# Patient Record
Sex: Female | Born: 1985
Health system: Southern US, Community
[De-identification: ages and names within clinical notes are randomized; demographics above are authoritative.]

## PROBLEM LIST (undated history)

## (undated) DIAGNOSIS — N83201 Unspecified ovarian cyst, right side: Secondary | ICD-10-CM

## (undated) DIAGNOSIS — F410 Panic disorder [episodic paroxysmal anxiety] without agoraphobia: Secondary | ICD-10-CM

## (undated) DIAGNOSIS — N83202 Unspecified ovarian cyst, left side: Secondary | ICD-10-CM

## (undated) DIAGNOSIS — F411 Generalized anxiety disorder: Secondary | ICD-10-CM

## (undated) DIAGNOSIS — E039 Hypothyroidism, unspecified: Secondary | ICD-10-CM

## (undated) HISTORY — DX: Hypothyroidism, unspecified: E03.9

## (undated) HISTORY — DX: Panic disorder (episodic paroxysmal anxiety): F41.0

## (undated) HISTORY — DX: Generalized anxiety disorder: F41.1

## (undated) HISTORY — DX: Unspecified ovarian cyst, right side: N83.202

## (undated) HISTORY — DX: Unspecified ovarian cyst, right side: N83.201

---

## 2016-09-30 DIAGNOSIS — D225 Melanocytic nevi of trunk: Secondary | ICD-10-CM | POA: Diagnosis not present

## 2017-03-31 DIAGNOSIS — E039 Hypothyroidism, unspecified: Secondary | ICD-10-CM | POA: Diagnosis not present

## 2017-08-11 DIAGNOSIS — E038 Other specified hypothyroidism: Secondary | ICD-10-CM | POA: Diagnosis not present

## 2017-08-11 DIAGNOSIS — E063 Autoimmune thyroiditis: Secondary | ICD-10-CM | POA: Diagnosis not present

## 2017-08-11 DIAGNOSIS — E039 Hypothyroidism, unspecified: Secondary | ICD-10-CM | POA: Diagnosis not present

## 2017-11-10 DIAGNOSIS — Z0000 Encounter for general adult medical examination without abnormal findings: Secondary | ICD-10-CM | POA: Diagnosis not present

## 2017-11-10 DIAGNOSIS — E039 Hypothyroidism, unspecified: Secondary | ICD-10-CM | POA: Diagnosis not present

## 2017-11-17 DIAGNOSIS — N6011 Diffuse cystic mastopathy of right breast: Secondary | ICD-10-CM | POA: Diagnosis not present

## 2017-11-17 DIAGNOSIS — E039 Hypothyroidism, unspecified: Secondary | ICD-10-CM | POA: Diagnosis not present

## 2017-11-17 DIAGNOSIS — Z01411 Encounter for gynecological examination (general) (routine) with abnormal findings: Secondary | ICD-10-CM | POA: Diagnosis not present

## 2017-11-17 DIAGNOSIS — F410 Panic disorder [episodic paroxysmal anxiety] without agoraphobia: Secondary | ICD-10-CM | POA: Diagnosis not present

## 2017-11-17 DIAGNOSIS — Z0000 Encounter for general adult medical examination without abnormal findings: Secondary | ICD-10-CM | POA: Diagnosis not present

## 2017-12-10 DIAGNOSIS — F4310 Post-traumatic stress disorder, unspecified: Secondary | ICD-10-CM | POA: Diagnosis not present

## 2017-12-22 DIAGNOSIS — F4310 Post-traumatic stress disorder, unspecified: Secondary | ICD-10-CM | POA: Diagnosis not present

## 2018-01-12 DIAGNOSIS — F4310 Post-traumatic stress disorder, unspecified: Secondary | ICD-10-CM | POA: Diagnosis not present

## 2018-01-26 DIAGNOSIS — F4310 Post-traumatic stress disorder, unspecified: Secondary | ICD-10-CM | POA: Diagnosis not present

## 2018-01-28 ENCOUNTER — Ambulatory Visit (INDEPENDENT_AMBULATORY_CARE_PROVIDER_SITE_OTHER): Payer: BLUE CROSS/BLUE SHIELD | Admitting: Primary Care

## 2018-01-28 ENCOUNTER — Encounter: Payer: Self-pay | Admitting: Primary Care

## 2018-01-28 VITALS — BP 110/70 | HR 72 | Temp 97.9°F | Ht 64.25 in | Wt 140.8 lb

## 2018-01-28 DIAGNOSIS — R002 Palpitations: Secondary | ICD-10-CM | POA: Diagnosis not present

## 2018-01-28 DIAGNOSIS — R39198 Other difficulties with micturition: Secondary | ICD-10-CM | POA: Insufficient documentation

## 2018-01-28 DIAGNOSIS — E039 Hypothyroidism, unspecified: Secondary | ICD-10-CM | POA: Diagnosis not present

## 2018-01-28 DIAGNOSIS — R350 Frequency of micturition: Secondary | ICD-10-CM | POA: Diagnosis not present

## 2018-01-28 DIAGNOSIS — N63 Unspecified lump in unspecified breast: Secondary | ICD-10-CM

## 2018-01-28 DIAGNOSIS — F410 Panic disorder [episodic paroxysmal anxiety] without agoraphobia: Secondary | ICD-10-CM | POA: Insufficient documentation

## 2018-01-28 HISTORY — DX: Other difficulties with micturition: R39.198

## 2018-01-28 LAB — COMPREHENSIVE METABOLIC PANEL
ALT: 10 U/L (ref 0–35)
AST: 12 U/L (ref 0–37)
Albumin: 4.1 g/dL (ref 3.5–5.2)
Alkaline Phosphatase: 45 U/L (ref 39–117)
BUN: 12 mg/dL (ref 6–23)
CHLORIDE: 106 meq/L (ref 96–112)
CO2: 27 meq/L (ref 19–32)
Calcium: 9.2 mg/dL (ref 8.4–10.5)
Creatinine, Ser: 0.79 mg/dL (ref 0.40–1.20)
GFR: 90.41 mL/min (ref >60.00)
GLUCOSE: 85 mg/dL (ref 70–99)
POTASSIUM: 4.2 meq/L (ref 3.5–5.1)
SODIUM: 137 meq/L (ref 135–145)
Total Bilirubin: 0.4 mg/dL (ref 0.2–1.2)
Total Protein: 7.1 g/dL (ref 6.0–8.3)

## 2018-01-28 LAB — CBC
HCT: 40.9 % (ref 36.0–46.0)
Hemoglobin: 14.2 g/dL (ref 12.0–15.0)
MCHC: 34.7 g/dL (ref 30.0–36.0)
MCV: 92.1 fl (ref 78.0–100.0)
Platelets: 242 10*3/uL (ref 150.0–400.0)
RBC: 4.44 Mil/uL (ref 3.87–5.11)
RDW: 12.2 % (ref 11.5–15.5)
WBC: 4 10*3/uL (ref 4.0–10.5)

## 2018-01-28 LAB — HEMOGLOBIN A1C: Hgb A1c MFr Bld: 4.8 % (ref 4.6–6.5)

## 2018-01-28 LAB — TSH: TSH: 4.18 u[IU]/mL (ref 0.35–4.50)

## 2018-01-28 NOTE — Progress Notes (Signed)
Subjective:    Patient ID: Amy Schneider, female    DOB: Apr 09, 1985, 32 y.o.   MRN: [id number]  HPI  [name] is a 32 year old female who presents today to establish care and discuss the problems mentioned below. Will obtain old records.  1) Hypothyroidism: Diagnosed around 2013-2014. Currently managed on Synthroid 25 mcg,  taking Synthroid 12.5 mcg daily. She will feel "manic" on the 25 mcg dose; will feel cold intolerance, hair loss, lethargy without medication all together. Her last TSH was 3.5 in May 2019. She endorses a repeat TSH that was done in August 2019 and was normal.   She takes her Synthroid every morning, waits 15 minutes before eating and doesn't take any medications with Synthroid.   2) Panic Attacks: Currently managed on propranolol 80 mg PRN. Panic attacks occur monthly on average which is more frequently before. Recently lost her mother. Feels well managed. She does experience heart palpitations, mostly occurring at night, sometimes occurs with and without anxiety. Her palpitations will occur once monthly lasting 10 seconds.   3) Abdominal Discomfort: She will experience bilateral lower quadrant abdominal discomfort, gas, oily bowel movements, diarrhea, occasional nausea. This will occur with certain foods such as mixing meats, stress, feeling anxious. Symptoms occur 3-4 times monthly, this has been intermittent for 2-3 years.   She denies unexplained weight loss, bloody stools, vomiting. She's never tried any medication.   4) Ovarian Cysts/Difficulty urinating: Diagnosed years ago with bilateral ovarian cysts, history of ruptured cysts. She is managed on Syeda 3-0.03 mg OCP's. She is not seeing GYN consistently.   She's also experienced symptoms of difficulty urinating. This began in January 2019 and has occurred three times total where she's had to physically push her lower abdomen to get herself to urinate. She denies hematuria, dysuria. She does have chronic  urinary frequency, doesn't drink a lot of water because of this. She does endorse intermittent episodes of slower urinary flow, separate from her incidences of difficulty urinating.   5) Breast Mass: Located to the right breast for which she first noticed several months ago. History of breast cancer in her paternal grandmother. The spot was evaluated by her prior PCP who suspected fibrocystic tissue. She never underwent ultrasound or mammogram.   Review of Systems  Constitutional: Negative for fever.  Respiratory: Negative for shortness of breath.   Cardiovascular: Positive for palpitations. Negative for chest pain.  Gastrointestinal:       Abdominal discomfort, bloating/gas, diarrhea. Intermittent.   Endocrine: Negative for cold intolerance and heat intolerance.  Genitourinary: Positive for difficulty urinating and frequency. Negative for dysuria and vaginal discharge.       Intermittent ovarian cysts  Skin: Negative for color change.  Neurological: Negative for headaches.  Hematological: Negative for adenopathy.  Psychiatric/Behavioral: The patient is nervous/anxious.         Past Medical History:  Diagnosis Date  . Bilateral ovarian cysts   . GAD (generalized anxiety disorder)   . Hypothyroidism   . Panic attacks      Social History   Socioeconomic History  . Marital status: Married    Spouse name: Not on file  . Number of children: Not on file  . Years of education: Not on file  . Highest education level: Not on file  Occupational History  . Not on file  Social Needs  . Financial resource strain: Not on file  . Food insecurity:    Worry: Not on file    Inability: Not  on file  . Transportation needs:    Medical: Not on file    Non-medical: Not on file  Tobacco Use  . Smoking status: Never Smoker  . Smokeless tobacco: Never Used  Substance and Sexual Activity  . Alcohol use: Not Currently  . Drug use: Not on file  . Sexual activity: Not on file  Lifestyle  .  Physical activity:    Days per week: Not on file    Minutes per session: Not on file  . Stress: Not on file  Relationships  . Social connections:    Talks on phone: Not on file    Gets together: Not on file    Attends religious service: Not on file    Active member of club or organization: Not on file    Attends meetings of clubs or organizations: Not on file    Relationship status: Not on file  . Intimate partner violence:    Fear of current or ex partner: Not on file    Emotionally abused: Not on file    Physically abused: Not on file    Forced sexual activity: Not on file  Other Topics Concern  . Not on file  Social History Narrative  . Not on file    History reviewed. No pertinent surgical history.  Family History  Problem Relation Age of Onset  . Alcohol abuse Mother   . Depression Mother   . Early death Mother   . Depression Sister   . Diabetes Maternal Grandmother   . Heart attack Maternal Grandmother   . Heart disease Maternal Grandmother   . Stroke Maternal Grandmother   . Parkinson's disease Maternal Grandfather   . Breast cancer Paternal Grandmother     Allergies  Allergen Reactions  . Hydrocodone-Acetaminophen Nausea And Vomiting    Current Outpatient Medications on File Prior to Visit  Medication Sig Dispense Refill  . drospirenone-ethinyl estradiol (SYEDA) 3-0.03 MG tablet 1 tablet once daily    . propranolol (INDERAL) 80 MG tablet propranolol 80 mg tablet  Take 1 tablet daily as needed forpanic    . SYNTHROID 25 MCG tablet Take 25 mcg by mouth daily before breakfast.     No current facility-administered medications on file prior to visit.     BP 110/70   Pulse 72   Temp 97.9 F (36.6 C) (Oral)   Ht 5' 4.25" (1.632 m)   Wt 140 lb 12 oz (63.8 kg)   LMP 12/30/2017   SpO2 98%   BMI 23.97 kg/m     Objective:   Physical Exam  Constitutional: She is oriented to person, place, and time. She appears well-nourished.  Neck: Neck supple.    Cardiovascular: Normal rate and regular rhythm.  Respiratory: Effort normal and breath sounds normal. Right breast exhibits mass. Right breast exhibits no skin change and no tenderness. Left breast exhibits no mass, no skin change and no tenderness.    Firm, 1 cm, lump noted to right breast at 12 o'clock position. Non tender.   GI: Soft. Bowel sounds are normal. There is no tenderness.  Neurological: She is alert and oriented to person, place, and time.  Skin: Skin is warm and dry.  Psychiatric: She has a normal mood and affect.           Assessment & Plan:

## 2018-01-28 NOTE — Assessment & Plan Note (Signed)
Diagnosed years ago, feels well on Synthroid 12.5 mg. Continue same. Repeat TSH pending.

## 2018-01-28 NOTE — Patient Instructions (Signed)
You will be contacted regarding your referral to Urology and for your mammogram/ultrasounds.  Please let us know if you have not been contacted within one week.   Start keeping track of the foods that cause your stomach symptoms. Take a look at the information below.  Stop by the lab prior to leaving today. I will notify you of your results once received.   It was a pleasure to meet you today! Please don't hesitate to call or message me with any questions. Welcome to [facility]!  Diet for Irritable Bowel Syndrome When you have irritable bowel syndrome (IBS), the foods you eat and your eating habits are very important. IBS may cause various symptoms, such as abdominal pain, constipation, or diarrhea. Choosing the right foods can help ease discomfort caused by these symptoms. Work with your health care provider and dietitian to find the best eating plan to help control your symptoms. What general guidelines do I need to follow?  Keep a food diary. This will help you identify foods that cause symptoms. Write down: ? What you eat and when. ? What symptoms you have. ? When symptoms occur in relation to your meals.  Avoid foods that cause symptoms. Talk with your dietitian about other ways to get the same nutrients that are in these foods.  Eat more foods that contain fiber. Take a fiber supplement if directed by your dietitian.  Eat your meals slowly, in a relaxed setting.  Aim to eat 5-6 small meals per day. Do not skip meals.  Drink enough fluids to keep your urine clear or pale yellow.  Ask your health care provider if you should take an over-the-counter probiotic during flare-ups to help restore healthy gut bacteria.  If you have cramping or diarrhea, try making your meals low in fat and high in carbohydrates. Examples of carbohydrates are pasta, rice, whole grain breads and cereals, fruits, and vegetables.  If dairy products cause your symptoms to flare up, try eating less of them. You  might be able to handle yogurt better than other dairy products because it contains bacteria that help with digestion. What foods are not recommended? The following are some foods and drinks that may worsen your symptoms:  Fatty foods, such as [place] fries.  Milk products, such as cheese or ice cream.  Chocolate.  Alcohol.  Products with caffeine, such as coffee.  Carbonated drinks, such as soda.  The items listed above may not be a complete list of foods and beverages to avoid. Contact your dietitian for more information. What foods are good sources of fiber? Your health care provider or dietitian may recommend that you eat more foods that contain fiber. Fiber can help reduce constipation and other IBS symptoms. Add foods with fiber to your diet a little at a time so that your body can get used to them. Too much fiber at once might cause gas and swelling of your abdomen. The following are some foods that are good sources of fiber:  Apples.  Peaches.  Pears.  Berries.  Figs.  Broccoli (raw).  Cabbage.  Carrots.  Raw peas.  Kidney beans.  Lima beans.  Whole grain bread.  Whole grain cereal.  Where to find more information: [employer] for Functional Gastrointestinal Disorders: www.iffgd.[employer] of Diabetes and Digestive and Kidney Diseases: NetworkAff[employer].co.za.aspx This information is not intended to replace advice given to you by your health care provider. Make sure you discuss any questions you have with your health care provider. Document Released:  06/01/2003 Document Revised: 08/17/2015 Document Reviewed: 06/11/2013 Elsevier Interactive Patient Education  [employer].

## 2018-01-28 NOTE — Assessment & Plan Note (Signed)
Dense breast tissue during exam today, lump of concern was palpated, firm. Orders for diagnostic mammogram and bilateral ultrasound placed.

## 2018-01-28 NOTE — Assessment & Plan Note (Signed)
Using propranolol PRN, continue same.

## 2018-01-28 NOTE — Assessment & Plan Note (Signed)
Question structural vs functional problem? History of ovarian cysts. Will start with Urology evaluation as she's undergone work up for ovarian cysts in the past, referral placed.

## 2018-01-28 NOTE — Assessment & Plan Note (Signed)
Intermittent. Could be anxiety vs hypothyroidism cause. ECG today with NSR, rate of 66, no ST elevation, PAC/PVC, t-wave inversion. Consider cardiology evaluation. Check labs today including TSH, CBC, CMP.

## 2018-02-09 ENCOUNTER — Ambulatory Visit
Admission: RE | Admit: 2018-02-09 | Discharge: 2018-02-09 | Disposition: A | Discharge status: Home / Self Care | Payer: BLUE CROSS/BLUE SHIELD | Source: Ambulatory Visit | Attending: Primary Care | Admitting: Primary Care

## 2018-02-09 ENCOUNTER — Ambulatory Visit: Payer: BLUE CROSS/BLUE SHIELD | Admitting: Urology

## 2018-02-09 ENCOUNTER — Encounter: Payer: Self-pay | Admitting: Urology

## 2018-02-09 VITALS — BP 120/77 | HR 73 | Ht 64.0 in | Wt 143.2 lb

## 2018-02-09 DIAGNOSIS — R928 Other abnormal and inconclusive findings on diagnostic imaging of breast: Secondary | ICD-10-CM | POA: Diagnosis not present

## 2018-02-09 DIAGNOSIS — N63 Unspecified lump in unspecified breast: Secondary | ICD-10-CM | POA: Insufficient documentation

## 2018-02-09 DIAGNOSIS — R39198 Other difficulties with micturition: Secondary | ICD-10-CM | POA: Diagnosis not present

## 2018-02-09 DIAGNOSIS — F4310 Post-traumatic stress disorder, unspecified: Secondary | ICD-10-CM | POA: Diagnosis not present

## 2018-02-09 DIAGNOSIS — N6489 Other specified disorders of breast: Secondary | ICD-10-CM | POA: Diagnosis not present

## 2018-02-09 LAB — BLADDER SCAN AMB NON-IMAGING

## 2018-02-09 LAB — URINALYSIS, COMPLETE
Bilirubin, UA: NEGATIVE
Glucose, UA: NEGATIVE
KETONES UA: NEGATIVE
Leukocytes, UA: NEGATIVE
NITRITE UA: NEGATIVE
Protein, UA: NEGATIVE
RBC, UA: NEGATIVE
SPEC GRAV UA: 1.015 (ref 1.005–1.030)
UUROB: 0.2 mg/dL (ref 0.2–1.0)
pH, UA: 7.5 (ref 5.0–7.5)

## 2018-02-09 LAB — MICROSCOPIC EXAMINATION
Bacteria, UA: NONE SEEN
RBC, UA: NONE SEEN /hpf (ref 0–2)
WBC UA: NONE SEEN /HPF (ref 0–5)

## 2018-02-09 NOTE — Patient Instructions (Signed)
Cystoscopy  Cystoscopy is a procedure that is used to help diagnose and sometimes treat conditions that affect that lower urinary tract. The lower urinary tract includes the bladder and the tube that drains urine from the bladder out of the body (urethra). Cystoscopy is performed with a thin, tube-shaped instrument with a light and camera at the end (cystoscope). The cystoscope may be hard (rigid) or flexible, depending on the goal of the procedure.The cystoscope is inserted through the urethra, into the bladder.  Cystoscopy may be recommended if you have:   Urinary tractinfections that keep coming back (recurring).   Blood in the urine (hematuria).   Loss of bladder control (urinary incontinence) or an overactive bladder.   Unusual cells found in a urine sample.   A blockage in the urethra.   Painful urination.   An abnormality in the bladder found during an intravenous pyelogram (IVP) or CT scan.    Cystoscopy may also be done to remove a sample of tissue to be examined under a microscope (biopsy).  Tell a health care provider about:   Any allergies you have.   All medicines you are taking, including vitamins, herbs, eye drops, creams, and over-the-counter medicines.   Any problems you or family members have had with anesthetic medicines.   Any blood disorders you have.   Any surgeries you have had.   Any medical conditions you have.   Whether you are pregnant or may be pregnant.  What are the risks?  Generally, this is a safe procedure. However, problems may occur, including:   Infection.   Bleeding.   Allergic reactions to medicines.   Damage to other structures or organs.    What happens before the procedure?   Ask your health care provider about:  ? Changing or stopping your regular medicines. This is especially important if you are taking diabetes medicines or blood thinners.  ? Taking medicines such as aspirin and ibuprofen. These medicines can thin your blood. Do not take these medicines  before your procedure if your health care provider instructs you not to.   Follow instructions from your health care provider about eating or drinking restrictions.   You may be given antibiotic medicine to help prevent infection.   You may have an exam or testing, such as X-rays of the bladder, urethra, or kidneys.   You may have urine tests to check for signs of infection.   Plan to have someone take you home after the procedure.  What happens during the procedure?   To reduce your risk of infection,your health care team will wash or sanitize their hands.   You will be given one or more of the following:  ? A medicine to help you relax (sedative).  ? A medicine to numb the area (local anesthetic).   The area around the opening of your urethra will be cleaned.   The cystoscope will be passed through your urethra into your bladder.   Germ-free (sterile)fluid will flow through the cystoscope to fill your bladder. The fluid will stretch your bladder so that your surgeon can clearly examine your bladder walls.   The cystoscope will be removed and your bladder will be emptied.  The procedure may vary among health care providers and hospitals.  What happens after the procedure?   You may have some soreness or pain in your abdomen and urethra. Medicines will be available to help you.   You may have some blood in your urine.   Do not   drive for 24 hours if you received a sedative.  This information is not intended to replace advice given to you by your health care provider. Make sure you discuss any questions you have with your health care provider.  Document Released: 03/08/2000 Document Revised: 07/20/2015 Document Reviewed: 01/26/2015  Elsevier Interactive Patient Education © 2018 Elsevier Inc.  Urodynamic Testing  What is urodynamic testing?  Urodynamic tests are done to determine how well your lower urinary tract  is working. The lower urinary tract includes your bladder and the tube that empties your bladder (urethra).  When your kidneys filter your blood, urine is stored in your bladder until you feel the urge to pass urine (urinate). Urination requires coordination between the nerves and muscles of your bladder and urethra. When your lower urinary tract is working well, you should be able to:  · Start urinating when your bladder is full.  · Empty your bladder completely.  · Control the flow of your urine.    Why do I need urodynamic testing?  You may need urodynamic testing if you:  · Are leaking urine (incontinence).  · Have problems starting or stopping your urine flow.  · Have frequent or painful urination.  · Have frequent urinary tract infections.  · Cannot empty your bladder completely.  · Have strong urges to pass urine (urgency).  · Have a weak flow of urine.    How is urodynamic testing done?  Urodynamic tests may be done separately or all during one testing visit. These tests may be done at your health care provider’s office, a clinic, or a hospital. You may be given an antibiotic medicine before or after testing to prevent infection. Ask your health care provider if you should:  · Stop taking any of your regular medicines.  · Arrive for the test with a full bladder.    The urodynamic tests you may have done include:  Uroflowmetry  This test measures how much urine you pass and how long it takes to pass.  · You sit on a special toilet to urinate.  · The toilet measures the volume and the time of your urine flow.  · These measurements are sent to a computer that creates a graph of your urine flow.    Postvoid residual measurement  This test measures how much urine is left in your bladder after you urinate.  · It uses sound waves (ultrasound) to create an image of your bladder.  · The test can also be done by inserting a thin, flexible tube (catheter) into your bladder after you urinate.   · Remaining urine is measured in milliliters (mL). If you have more than 100 mL left in your bladder after you urinate, your bladder is not emptying as it should.    Cystometric testing  This test uses a special bladder catheter that can measure pressure.  · A numbing medicine (local anesthetic) may be used.  · First, a normal catheter is used to empty your bladder completely.  · Then the measuring catheter is placed, and your bladder is filled with warm, germ-free (sterile) water.  · Pressure measurements will be taken:  ? As your bladder fills.  ? When you feel the need to urinate.  ? As your bladder is emptied.  · You may be asked to cough or bear down to check for leakage.  · In some cases, your bladder may be filled with a material that shows up on X-rays (contrast material) so that X-ray   pictures can be taken during the test.    Electromyography  This test measures the electrical activity of the nerves and muscles of your bladder and the opening of your urethra.  · It tells how well your nerves are communicating with your muscles.  · Sticky patches are placed near your rectum and urethra to measure electrical activity.    What are the risks of this testing?  Generally, these tests are safe. However, problems can occur and include:  · Discomfort.  · Frequent urge to urinate.  · Bleeding.  · Infection.  · An allergic reaction to contrast material, if contrast material is used.    What happens after the testing?  · You should be able to go home right away and do your usual activities.  · You may be instructed to drink a tall glass of water every 30 minutes for the first 2 hours you are home.  · Taking a warm bath or using a warm compress may relieve any discomfort near your urethra.  Let your health care provider know if you have:  · Pain.  · Blood in your urine.  · Chills.  · Fever.    What do my results mean?  Discuss the results of your urodynamic tests with your health care  provider. Your health care provider will use the results of these and other tests, along with your signs and symptoms, to make a diagnosis. Some common causes for abnormal results from urodynamic tests include:  · Enlarged prostate in men.  · Overactive bladder.  · Urinary tract infection.  · Nervous system diseases.  · Spinal cord damage.    This information is not intended to replace advice given to you by your health care provider. Make sure you discuss any questions you have with your health care provider.  Document Released: 01/06/2007 Document Revised: 02/06/2016 Document Reviewed: 06/21/2013  Elsevier Interactive Patient Education © 2017 Elsevier Inc.

## 2018-02-09 NOTE — Progress Notes (Signed)
02/09/2018 3:57 PM   Providence Coutts 1985-08-18 [id number]  Referring provider: Pleas Koch, NP [place], [place] [address]  Chief Complaint  Patient presents with  . Establish Care    Difficulty urinating     HPI: Patient is a 32 year old Caucasian female who was referred by Pleas Koch, NP for difficulty urinating.   She states starting in January 2019 she has had to physically push down on her lower abdomen to get herself to urinate.  She states this occurs first thing in the morning and then bladder function improves as the day progresses.  This lasts for a few days at a time.    She denies any muscle weakness or visual disturbances.  She has constipation when she has ovarian cysts.  Patient denies any gross hematuria, dysuria or suprapubic/flank pain.  Patient denies any fevers, chills, nausea or vomiting.  She has not taken any OTC medications for colds.    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  UA with a amorphous sediment.  Her PVR 17 mL.    She is drinking 40 to 60 ounces of water daily.  She does not drink soda.  She drinks honest juice boxes.  She does not drink tea.  She does not drink alcohol.  She does not drink coffee.    Family history of DM.  Hgb A1c 4.8%.  No family history of neurological disease.    She has not had surgery on her bladder or bladder injury.  No history of stones.    PMH: Past Medical History:  Diagnosis Date  . Bilateral ovarian cysts   . GAD (generalized anxiety disorder)   . Hypothyroidism   . Panic attacks     Surgical History: No past surgical history on file.  Home Medications:  Allergies as of 02/09/2018      Reactions   Hydrocodone-acetaminophen Nausea And Vomiting      Medication List        Accurate as of 02/09/18  3:57 PM. Always use your most recent  med list.          propranolol 80 MG tablet Commonly known as:  INDERAL propranolol 80 mg tablet  Take 1 tablet daily as needed forpanic   SYEDA 3-0.03 MG tablet Generic drug:  drospirenone-ethinyl estradiol 1 tablet once daily   SYNTHROID 25 MCG tablet Generic drug:  levothyroxine Take 12.5 mcg by mouth daily before breakfast.       Allergies:  Allergies  Allergen Reactions  . Hydrocodone-Acetaminophen Nausea And Vomiting    Family History: Family History  Problem Relation Age of Onset  . Alcohol abuse Mother   . Depression Mother   . Early death Mother   . Depression Sister   . Diabetes Maternal Grandmother   . Heart attack Maternal Grandmother   . Heart disease Maternal Grandmother   . Stroke Maternal Grandmother   . Parkinson's disease Maternal Grandfather   . Breast cancer Paternal Grandmother     Social History:  reports that she has never smoked. She has never used smokeless tobacco. She reports that she drank alcohol. Her drug history is not on file.  ROS: UROLOGY Frequent Urination?: Yes Hard to postpone urination?: No Burning/pain with urination?: No Get up at night to urinate?: Yes Leakage of urine?: Yes Urine stream starts and stops?: No Trouble starting  stream?: Yes Do you have to strain to urinate?: Yes Blood in urine?: No Urinary tract infection?: No Sexually transmitted disease?: No Injury to kidneys or bladder?: No Painful intercourse?: No Weak stream?: Yes Currently pregnant?: No Vaginal bleeding?: No Last menstrual period?: n  Gastrointestinal Nausea?: No Vomiting?: No Indigestion/heartburn?: No Diarrhea?: Yes Constipation?: No  Constitutional Fever: No Night sweats?: Yes Weight loss?: No Fatigue?: No  Skin Skin rash/lesions?: No Itching?: No  Eyes Blurred vision?: No Double vision?: No  Ears/Nose/Throat Sore throat?: No Sinus problems?: No  Hematologic/Lymphatic Swollen glands?: No Easy bruising?:  No  Cardiovascular Leg swelling?: No Chest pain?: No  Respiratory Cough?: No Shortness of breath?: No  Endocrine Excessive thirst?: Yes  Musculoskeletal Back pain?: No Joint pain?: No  Neurological Headaches?: No Dizziness?: No  Psychologic Depression?: Yes Anxiety?: Yes  Physical Exam: BP 120/77 (BP Location: Left Arm, Patient Position: Sitting, Cuff Size: Normal)   Pulse 73   Ht 5\' 4"  (1.626 m)   Wt 143 lb 3.2 oz (65 kg)   LMP 12/26/2017   BMI 24.58 kg/m   Constitutional:  Well nourished. Alert and oriented, No acute distress. HEENT: [place] AT, moist mucus membranes.  Trachea midline, no masses. Cardiovascular: No clubbing, cyanosis, or edema. Respiratory: Normal respiratory effort, no increased work of breathing. GI: Abdomen is soft, non tender, non distended, no abdominal masses. Liver and spleen not palpable.  No hernias appreciated.  Stool sample for occult testing is not indicated.   GU: No CVA tenderness.  No bladder fullness or masses.  Normal external genitalia, normal pubic hair distribution, no lesions.  Normal urethral meatus, no lesions, no prolapse, no discharge.   No urethral masses, tenderness and/or tenderness. No bladder fullness, tenderness or masses. Normal vagina mucosa, good estrogen effect, no discharge, no lesions, good pelvic support, no cystocele or rectocele noted.  No cervical motion tenderness.  Uterus is freely mobile and non-fixed.  No adnexal/parametria masses or tenderness noted.  Anus and perineum are without rashes or lesions.    Skin: No rashes, bruises or suspicious lesions. Lymph: No cervical or inguinal adenopathy. Neurologic: Grossly intact, no focal deficits, moving all 4 extremities. Psychiatric: Normal mood and affect.  Laboratory Data: Lab Results  Component Value Date   WBC 4.0 01/28/2018   HGB 14.2 01/28/2018   HCT 40.9 01/28/2018   MCV 92.1 01/28/2018   PLT 242.0 01/28/2018    Lab Results  Component Value Date    CREATININE 0.79 01/28/2018    No results found for: PSA  No results found for: TESTOSTERONE  Lab Results  Component Value Date   HGBA1C 4.8 01/28/2018    Lab Results  Component Value Date   TSH 4.18 01/28/2018    No results found for: CHOL, HDL, CHOLHDL, VLDL, LDLCALC  Lab Results  Component Value Date   AST 12 01/28/2018   Lab Results  Component Value Date   ALT 10 01/28/2018   No components found for: ALKALINEPHOPHATASE No components found for: BILIRUBINTOTAL  No results found for: ESTRADIOL  Urinalysis Bland.  See Epic. I have reviewed the labs.   Pertinent Imaging: Results for ILHAN, DEBENEDETTO (MRN [id number]) as of 02/09/2018 15:54  Ref. Range 02/09/2018 15:04  Scan Result Unknown 17 ml    I have independently reviewed the films.    Assessment & Plan:    1. 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 by holding her for long periods  of time and she is now seeing the effects of this, I have suggested that she be mindful the day she visits the restroom every 2 hours and attempt to empty her bladder to get her on a more reasonable schedule I would also like her to come back and undergo cystoscopy to evaluate her internal bladder anatomy to ensure there is nothing contributing to her difficulty with urination and then urodynamic studies if warranted pending cystoscopy results as her bladder symptoms may be the result of something more ominous such as a neurological condition - Urinalysis, Complete - Bladder Scan (Post Void Residual) in office   Return for cysto with Dr. Matilde Sprang.  These notes generated with voice recognition software. I apologize for typographical errors.  Zara Council, PA-C  [facility] [address]  [place], [place] [address] [phone]

## 2018-02-16 DIAGNOSIS — F4310 Post-traumatic stress disorder, unspecified: Secondary | ICD-10-CM | POA: Diagnosis not present

## 2018-02-23 ENCOUNTER — Ambulatory Visit: Payer: BLUE CROSS/BLUE SHIELD | Admitting: Family Medicine

## 2018-02-23 VITALS — BP 102/62 | HR 76 | Temp 99.3°F | Ht 64.0 in | Wt 142.4 lb

## 2018-02-23 DIAGNOSIS — N63 Unspecified lump in unspecified breast: Secondary | ICD-10-CM | POA: Diagnosis not present

## 2018-02-23 DIAGNOSIS — J069 Acute upper respiratory infection, unspecified: Secondary | ICD-10-CM | POA: Diagnosis not present

## 2018-02-23 DIAGNOSIS — J029 Acute pharyngitis, unspecified: Secondary | ICD-10-CM | POA: Diagnosis not present

## 2018-02-23 LAB — POCT RAPID STREP A (OFFICE): RAPID STREP A SCREEN: NEGATIVE

## 2018-02-23 NOTE — Assessment & Plan Note (Signed)
Symptoms are most consistent with viral upper respiratory infection.  Rapid strep test was negative.  Discussed supportive care with Zyrtec or Claritin, ibuprofen or Aleve, and/or Flonase.  Discussed Flonase would likely be most beneficial.  She will return to see me or her PCP in 1 month for recheck of the supramandibular lymph node.

## 2018-02-23 NOTE — Assessment & Plan Note (Signed)
Noted to have had prior mammogram that was negative.  Discussed follow-up with her PCP for reexam.  I will forward my note to her PCP.

## 2018-02-23 NOTE — Patient Instructions (Signed)
Nice to see you. Your symptoms are likely related to a virus. You can use Claritin or Zyrtec.  Flonase might be helpful as well.  You can also use anti-inflammatories such as ibuprofen or Aleve to help with any discomfort. We will have you return to see me or your PCP in a month to recheck the lymph nodes.

## 2018-02-23 NOTE — Progress Notes (Signed)
  Tommi Rumps, MD Phone: [phone]  Amy Schneider is a 32 y.o. female who presents today for same-day visit.  CC: Sore throat.  Patient notes onset of symptoms yesterday.  Feels like razors in her throat.  She notes sinus congestion.  She has postnasal drip.  She has felt slightly feverish.  She does note positive sick contacts.  She is tried over-the-counter cold and flu medication.  She does note feeling a supramandibular lymph node on the left.  She wonders if she has strep throat.  Social History   Tobacco Use  Smoking Status Never Smoker  Smokeless Tobacco Never Used     ROS see history of present illness  Objective  Physical Exam Vitals:   02/23/18 1616  BP: 102/62  Pulse: 76  Temp: 99.3 F (37.4 C)  SpO2: 97%    BP Readings from Last 3 Encounters:  02/23/18 102/62  02/09/18 120/77  01/28/18 110/70   Wt Readings from Last 3 Encounters:  02/23/18 142 lb 6.4 oz (64.6 kg)  02/09/18 143 lb 3.2 oz (65 kg)  01/28/18 140 lb 12 oz (63.8 kg)    Physical Exam  Constitutional: No distress.  HENT:  Head: Normocephalic and atraumatic.  Posterior oropharynx mildly erythematous, no exudate, no tonsillar enlargement, normal TMs bilaterally  Eyes: Pupils are equal, round, and reactive to light. Conjunctivae are normal.  Neck: Neck supple.  Small palpable supramandibular lymph node noted on the left, no parotid swelling or tenderness  Cardiovascular: Normal rate, regular rhythm and normal heart sounds.  Pulmonary/Chest: Effort normal and breath sounds normal.  Musculoskeletal: She exhibits no edema.  Lymphadenopathy:       Head (right side): No submental, no submandibular and no posterior auricular adenopathy present.       Head (left side): No submental, no submandibular and no posterior auricular adenopathy present.    She has no cervical adenopathy.  Neurological: She is alert.  Skin: Skin is warm and dry. She is not diaphoretic.     Assessment/Plan:  Please see individual problem list.  URI (upper respiratory infection) Symptoms are most consistent with viral upper respiratory infection.  Rapid strep test was negative.  Discussed supportive care with Zyrtec or Claritin, ibuprofen or Aleve, and/or Flonase.  Discussed Flonase would likely be most beneficial.  She will return to see me or her PCP in 1 month for recheck of the supramandibular lymph node.  Breast mass Noted to have had prior mammogram that was negative.  Discussed follow-up with her PCP for reexam.  I will forward my note to her PCP.   Orders Placed This Encounter  Procedures  . POCT rapid strep A    No orders of the defined types were placed in this encounter.    Tommi Rumps, MD [place]

## 2018-02-25 ENCOUNTER — Telehealth: Payer: Self-pay | Admitting: Primary Care

## 2018-02-25 NOTE — Telephone Encounter (Signed)
-----   Message from Leone Haven, MD sent at 02/23/2018  5:18 PM EST ----- Wells Guiles,   I saw this patient of yours today for a URI. She had a palpable lymph node along her left jaw line. I advised her to follow-up with you or me in one month for recheck to ensure that this resolved. I also discussed having her follow-up with you in the near future for recheck of the breast lesion that you previously palpated to ensure resolution. Please let me know if you have any questions.   Randall Hiss

## 2018-02-25 NOTE — Telephone Encounter (Signed)
Amy Schneider, will you have patient scheduled with me in one month for recheck of her lymph node and breast lesion?

## 2018-02-26 NOTE — Telephone Encounter (Signed)
lvm asking pt to call office °

## 2018-03-09 ENCOUNTER — Ambulatory Visit: Payer: BLUE CROSS/BLUE SHIELD | Admitting: Urology

## 2018-03-09 ENCOUNTER — Encounter: Payer: Self-pay | Admitting: Urology

## 2018-03-09 VITALS — BP 104/69 | HR 73 | Ht 64.0 in | Wt 142.3 lb

## 2018-03-09 DIAGNOSIS — R39198 Other difficulties with micturition: Secondary | ICD-10-CM

## 2018-03-09 DIAGNOSIS — F4310 Post-traumatic stress disorder, unspecified: Secondary | ICD-10-CM | POA: Diagnosis not present

## 2018-03-09 LAB — URINALYSIS, COMPLETE
Bilirubin, UA: NEGATIVE
GLUCOSE, UA: NEGATIVE
Ketones, UA: NEGATIVE
Leukocytes, UA: NEGATIVE
Nitrite, UA: NEGATIVE
Protein, UA: NEGATIVE
SPEC GRAV UA: 1.025 (ref 1.005–1.030)
Urobilinogen, Ur: 0.2 mg/dL (ref 0.2–1.0)
pH, UA: 5.5 (ref 5.0–7.5)

## 2018-03-09 LAB — MICROSCOPIC EXAMINATION

## 2018-03-09 NOTE — Progress Notes (Signed)
03/09/2018 9:19 AM   Amy Schneider September 14, 1985 [id number]  Referring provider: Pleas Koch, NP [place], [place] [address]  Chief Complaint  Patient presents with  . Cysto    HPI: Amy Schneider:  She states starting in January 2019 she has had to physically push down on her lower abdomen to get herself to urinate.  She states this occurs first thing in the morning and then bladder function improves as the day progresses.  This lasts for a few days at a time.  She was having some frequency and was scheduled for cystoscopy  Today For approximately 9 months patient especially in the morning has hesitancy and cannot urinate at times.  If she does not push on her lower abdomen she might not be able to urinate.  Her flow the rest the day is reasonable.  She does feel empty.  She voids every 2 or 3 hours and sometimes gets up once at night.  She has no neurologic symptoms of bowel bladder or lower extremities.  She is not had previous bladder surgery.  Modifying factors: There are no other modifying factors  Associated signs and symptoms: There are no other associated signs and symptoms Aggravating and relieving factors: There are no other aggravating or relieving factors Severity: Moderate Duration: Persistent  After verbal consent and written consent patient underwent sterile flexible cystoscopy.  Bladder mucosa and trigone were normal.  There is no cystitis or carcinoma.  It was difficult to fill her bladder likely from her uterus.  Urethra was normal.  She tolerated procedure well  On physical examination she had no prolapse or urethral abnormalities.  I felt there was some firmness at the level of the apex of the vagina at the level of the cervix.  She is followed by her primary care.  The findings likely were in keeping with the cystoscopy more difficult to fill her bladder.    PMH: Past Medical History:  Diagnosis Date  . Bilateral ovarian cysts   . GAD  (generalized anxiety disorder)   . Hypothyroidism   . Panic attacks     Surgical History: History reviewed. No pertinent surgical history.  Home Medications:  Allergies as of 03/09/2018      Reactions   Hydrocodone-acetaminophen Nausea And Vomiting      Medication List       Accurate as of March 09, 2018  9:19 AM. Always use your most recent med list.        propranolol 80 MG tablet Commonly known as:  INDERAL propranolol 80 mg tablet  Take 1 tablet daily as needed forpanic   SYEDA 3-0.03 MG tablet Generic drug:  drospirenone-ethinyl estradiol 1 tablet once daily   SYNTHROID 25 MCG tablet Generic drug:  levothyroxine Take 12.5 mcg by mouth daily before breakfast.       Allergies:  Allergies  Allergen Reactions  . Hydrocodone-Acetaminophen Nausea And Vomiting    Family History: Family History  Problem Relation Age of Onset  . Alcohol abuse Mother   . Depression Mother   . Early death Mother   . Depression Sister   . Diabetes Maternal Grandmother   . Heart attack Maternal Grandmother   . Heart disease Maternal Grandmother   . Stroke Maternal Grandmother   . Parkinson's disease Maternal Grandfather   . Breast cancer Paternal Grandmother     Social History:  reports that she has never smoked. She has never used smokeless tobacco. She reports previous alcohol use. No  history on file for drug.  ROS:                                        Physical Exam: BP 104/69 (BP Location: Left Arm, Patient Position: Sitting, Cuff Size: Normal)   Pulse 73   Ht 5\' 4"  (1.626 m)   Wt 142 lb 4.8 oz (64.5 kg)   BMI 24.43 kg/m   Constitutional:  Alert and oriented, No acute distress. HEENT: [place] AT, moist mucus membranes.  Trachea midline, no masses. Cardiovascular: No clubbing, cyanosis, or edema. Respiratory: Normal respiratory effort, no increased work of breathing. GI: Abdomen is soft, nontender, nondistended, no abdominal masses GU: No  CVA tenderness.  As noted above Skin: No rashes, bruises or suspicious lesions. Lymph: No cervical or inguinal adenopathy. Neurologic: Grossly intact, no focal deficits, moving all 4 extremities. Psychiatric: Normal mood and affect.  Laboratory Data: Lab Results  Component Value Date   WBC 4.0 01/28/2018   HGB 14.2 01/28/2018   HCT 40.9 01/28/2018   MCV 92.1 01/28/2018   PLT 242.0 01/28/2018    Lab Results  Component Value Date   CREATININE 0.79 01/28/2018    No results found for: PSA  No results found for: TESTOSTERONE  Lab Results  Component Value Date   HGBA1C 4.8 01/28/2018    Urinalysis    Component Value Date/Time   APPEARANCEUR Cloudy (A) 02/09/2018 1450   GLUCOSEU Negative 02/09/2018 1450   BILIRUBINUR Negative 02/09/2018 1450   PROTEINUR Negative 02/09/2018 1450   NITRITE Negative 02/09/2018 1450   LEUKOCYTESUR Negative 02/09/2018 1450    Pertinent Imaging:   Assessment & Plan: The pathophysiology of retention symptoms and urodynamics discussed.  I think it be very reasonable to have her have one good checkup by her gynecologist.  She will ask her primary care who is a good gynecologist and she did not have a good relationship with 1 recently.  Urodynamic schedule  1. Difficulty urinating  - Urinalysis, Complete   No follow-ups on file.  Reece Packer, MD  [facility] [address], [place],  [address] [phone]

## 2018-03-19 NOTE — Progress Notes (Signed)
Called patient and left a VM to call back. CRM created and sent to [facility] pool.  

## 2018-03-30 DIAGNOSIS — F4310 Post-traumatic stress disorder, unspecified: Secondary | ICD-10-CM | POA: Diagnosis not present

## 2018-04-07 ENCOUNTER — Telehealth: Payer: Self-pay | Admitting: Urology

## 2018-04-07 NOTE — Telephone Encounter (Signed)
Per Hassan Rowan @ [facility] she has tried to reach the patient 3 times to schedule UDS, left three separate messages to call me with no return call.  Sharyn Lull

## 2018-04-09 NOTE — Progress Notes (Signed)
Called patient and left a VM to call back.  

## 2018-04-13 DIAGNOSIS — F4310 Post-traumatic stress disorder, unspecified: Secondary | ICD-10-CM | POA: Diagnosis not present

## 2018-04-20 DIAGNOSIS — E039 Hypothyroidism, unspecified: Secondary | ICD-10-CM | POA: Diagnosis not present

## 2018-04-22 DIAGNOSIS — R351 Nocturia: Secondary | ICD-10-CM | POA: Diagnosis not present

## 2018-04-22 DIAGNOSIS — R350 Frequency of micturition: Secondary | ICD-10-CM | POA: Diagnosis not present

## 2018-04-22 DIAGNOSIS — R3911 Hesitancy of micturition: Secondary | ICD-10-CM | POA: Diagnosis not present

## 2018-04-27 ENCOUNTER — Other Ambulatory Visit: Payer: Self-pay | Admitting: Urology

## 2018-04-27 DIAGNOSIS — F4310 Post-traumatic stress disorder, unspecified: Secondary | ICD-10-CM | POA: Diagnosis not present

## 2018-05-11 DIAGNOSIS — F4310 Post-traumatic stress disorder, unspecified: Secondary | ICD-10-CM | POA: Diagnosis not present

## 2018-05-18 ENCOUNTER — Ambulatory Visit (INDEPENDENT_AMBULATORY_CARE_PROVIDER_SITE_OTHER): Payer: BLUE CROSS/BLUE SHIELD | Admitting: Urology

## 2018-05-18 ENCOUNTER — Encounter: Payer: Self-pay | Admitting: Urology

## 2018-05-18 ENCOUNTER — Ambulatory Visit: Payer: BLUE CROSS/BLUE SHIELD | Admitting: Urology

## 2018-05-18 VITALS — BP 117/69 | HR 68 | Ht 64.0 in | Wt 143.0 lb

## 2018-05-18 DIAGNOSIS — R39198 Other difficulties with micturition: Secondary | ICD-10-CM | POA: Diagnosis not present

## 2018-05-18 NOTE — Progress Notes (Signed)
05/18/2018 8:23 AM   Jaslin Knabe December 01, 1985 [id number]  Referring provider: Pleas Koch, NP [place],  [address]  No chief complaint on file.   HPI: [name]:  She states starting in January 2019 she has had to physically push down on her lower abdomen to get herself to urinate.She states this occurs first thing in the morning and then bladder function improves as the day progresses. This lasts for a few days at a time.    Today For approximately 9 months patient especially in the morning has hesitancy and cannot urinate at times.  If she does not push on her lower abdomen she might not be able to urinate.  Her flow the rest the day is reasonable.  She does feel empty.  She voids every 2 or 3 hours and sometimes gets up once at night.  She has no neurologic symptoms of bowel bladder or lower extremities.  She is not had previous bladder surgery.  After verbal consent and written consent patient underwent sterile flexible cystoscopy.  Bladder mucosa and trigone were normal.  There is no cystitis or carcinoma.  It was difficult to fill her bladder likely from her uterus.  Urethra was normal.  She tolerated procedure well  On physical examination she had no prolapse or urethral abnormalities.  I felt there was some firmness at the level of the apex of the vagina at the level of the cervix.  She is followed by her primary care.  The findings likely were in keeping with the cystoscopy more difficult to fill her bladder.  And was referred to gynecology and urodynamics were ordered  Today Frequency and flow stable On urodynamics she was catheterized for 50 mL.  Maximum bladder capacity was 407 mL.  Bladder was stable.  She did not have stress incontinence with a pressure of 70 cm water.  She did generate a voluntary contraction.  Contraction was well sustained.  She voided 355 mL with a max flow 60 mils per second.  Maximum voiding pressure was 25 cm of  water.  Residual was 50 mL.  EMG activity was increased during the voiding phase.  Bladder neck descended 2 cm.  She had no bladder pain.  She had a tingling sensation in the urethra just prior to voiding and afterwards.  The details of the urodynamics are signed and dictated      PMH: Past Medical History:  Diagnosis Date  . Bilateral ovarian cysts   . GAD (generalized anxiety disorder)   . Hypothyroidism   . Panic attacks     Surgical History: No past surgical history on file.  Home Medications:  Allergies as of 05/18/2018      Reactions   Hydrocodone-acetaminophen Nausea And Vomiting      Medication List       Accurate as of May 18, 2018  8:23 AM. Always use your most recent med list.        propranolol 80 MG tablet Commonly known as:  INDERAL propranolol 80 mg tablet  Take 1 tablet daily as needed forpanic   SYEDA 3-0.03 MG tablet Generic drug:  drospirenone-ethinyl estradiol 1 tablet once daily   SYNTHROID 25 MCG tablet Generic drug:  levothyroxine Take 12.5 mcg by mouth daily before breakfast.       Allergies:  Allergies  Allergen Reactions  . Hydrocodone-Acetaminophen Nausea And Vomiting    Family History: Family History  Problem Relation Age of Onset  . Alcohol abuse Mother   .  Depression Mother   . Early death Mother   . Depression Sister   . Diabetes Maternal Grandmother   . Heart attack Maternal Grandmother   . Heart disease Maternal Grandmother   . Stroke Maternal Grandmother   . Parkinson's disease Maternal Grandfather   . Breast cancer Paternal Grandmother     Social History:  reports that she has never smoked. She has never used smokeless tobacco. She reports previous alcohol use. No history on file for drug.  ROS:                                        Physical Exam: There were no vitals taken for this visit.  Constitutional:  Alert and oriented, No acute distress.  Laboratory Data: Lab Results    Component Value Date   WBC 4.0 01/28/2018   HGB 14.2 01/28/2018   HCT 40.9 01/28/2018   MCV 92.1 01/28/2018   PLT 242.0 01/28/2018    Lab Results  Component Value Date   CREATININE 0.79 01/28/2018    No results found for: PSA  No results found for: TESTOSTERONE  Lab Results  Component Value Date   HGBA1C 4.8 01/28/2018    Urinalysis    Component Value Date/Time   APPEARANCEUR Clear 03/09/2018 0839   GLUCOSEU Negative 03/09/2018 0839   BILIRUBINUR Negative 03/09/2018 0839   PROTEINUR Negative 03/09/2018 0839   NITRITE Negative 03/09/2018 0839   LEUKOCYTESUR Negative 03/09/2018 0839    Pertinent Imaging:    Assessment & Plan: The patient has a normal bladder as it pertains to the voiding phase.  She may have some pelvic floor dyssynergia.  The role of alpha-blocker and physical therapy and gynecology referral discussed.  The uterus as a source of her voiding symptoms would be quite uncommon but the presentation was a little bit out of the ordinary.  Skull therapy consultation given.  She does not want to try medication.  She will see a gynecologist.  I will see her PRN  There are no diagnoses linked to this encounter.  No follow-ups on file.  Reece Packer, MD  [facility] [address], [place], [place] [address] [phone]

## 2018-06-01 ENCOUNTER — Ambulatory Visit: Payer: BLUE CROSS/BLUE SHIELD | Admitting: Urology

## 2018-06-30 DIAGNOSIS — Z3041 Encounter for surveillance of contraceptive pills: Secondary | ICD-10-CM | POA: Diagnosis not present

## 2018-08-06 ENCOUNTER — Ambulatory Visit: Payer: BLUE CROSS/BLUE SHIELD | Admitting: Obstetrics & Gynecology

## 2018-08-06 ENCOUNTER — Encounter: Payer: Self-pay | Admitting: Obstetrics & Gynecology

## 2018-08-06 ENCOUNTER — Other Ambulatory Visit: Payer: Self-pay

## 2018-08-06 VITALS — BP 120/78 | HR 72 | Wt 146.0 lb

## 2018-08-06 DIAGNOSIS — N852 Hypertrophy of uterus: Secondary | ICD-10-CM

## 2018-08-06 DIAGNOSIS — Z3202 Encounter for pregnancy test, result negative: Secondary | ICD-10-CM

## 2018-08-06 DIAGNOSIS — R339 Retention of urine, unspecified: Secondary | ICD-10-CM | POA: Diagnosis not present

## 2018-08-06 DIAGNOSIS — R300 Dysuria: Secondary | ICD-10-CM | POA: Diagnosis not present

## 2018-08-06 LAB — POCT URINALYSIS DIPSTICK
Glucose, UA: NEGATIVE
Leukocytes, UA: NEGATIVE
Protein, UA: NEGATIVE
Spec Grav, UA: 1.01 (ref 1.010–1.025)
Urobilinogen, UA: 0.2 E.U./dL
pH, UA: 5 (ref 5.0–8.0)

## 2018-08-06 LAB — POCT URINE PREGNANCY: Preg Test, Ur: NEGATIVE

## 2018-08-06 NOTE — Progress Notes (Signed)
Urine Patient reports having some urination problems after sex that started a year ago. Last Pap 2019-normal

## 2018-08-06 NOTE — Addendum Note (Signed)
Addended by: Crosby Oyster on: 08/06/2018 03:45 PM   Modules accepted: Orders

## 2018-08-06 NOTE — Progress Notes (Addendum)
GYNECOLOGY OFFICE VISIT NOTE  History:   Amy Schneider is a 32 y.o. G0 F here today for evaluation for possible physical obstruction of bladder/urinary system by her uterus.  Reported having urinary retention for over a year, has to manually press on her bladder to express urine, but never feels like her bladder empties. Had negative evaluation by Urology on 05/18/18.  She denies any abnormal vaginal discharge, bleeding, pelvic pain or other concerns.    Past Medical History:  Diagnosis Date  . Bilateral ovarian cysts   . GAD (generalized anxiety disorder)   . Hypothyroidism   . Panic attacks     History reviewed. No pertinent surgical history.  The following portions of the patient's history were reviewed and updated as appropriate: allergies, current medications, past family history, past medical history, past social history, past surgical history and problem list.   Health Maintenance:  Normal pap in 2019 as per patient.  Review of Systems:  Pertinent items noted in HPI and remainder of comprehensive ROS otherwise negative.  Physical Exam:  BP 120/78   Pulse 72   Wt 146 lb (66.2 kg)   LMP 07/17/2018   BMI 25.06 kg/m  CONSTITUTIONAL: Well-developed, well-nourished female in no acute distress.  HEENT:  Normocephalic, atraumatic. External right and left ear normal. No scleral icterus.  NECK: Normal range of motion, supple, no masses noted on observation SKIN: No rash noted. Not diaphoretic. No erythema. No pallor. MUSCULOSKELETAL: Normal range of motion. No edema noted. NEUROLOGIC: Alert and oriented to person, place, and time. Normal muscle tone coordination. No cranial nerve deficit noted. PSYCHIATRIC: Normal mood and affect. Normal behavior. Normal judgment and thought content. CARDIOVASCULAR: Normal heart rate noted RESPIRATORY: Effort and breath sounds normal, no problems with respiration noted ABDOMEN: No masses noted. No other overt distention noted.   PELVIC:  Normal appearing external genitalia; large, firm, immobile, globular retroverted uterus palpated, fills entire posterior cul-de-sac. Cervix pushed very anteriorly.   No abnormal discharge noted.  No uterine or adnexal tenderness.  Labs: Results for orders placed or performed in visit on 08/06/18 (from the past 24 hour(s))  POCT urinalysis dipstick     Status: Normal   Collection Time: 08/06/18  2:50 PM  Result Value Ref Range   Color, UA yellow    Clarity, UA clear    Glucose, UA Negative Negative   Bilirubin, UA     Ketones, UA     Spec Grav, UA 1.010 1.010 - 1.025   Blood, UA     pH, UA 5.0 5.0 - 8.0   Protein, UA Negative Negative   Urobilinogen, UA 0.2 0.2 or 1.0 E.U./dL   Nitrite, UA     Leukocytes, UA Negative Negative   Appearance     Odor    POCT urine pregnancy     Status: Normal   Collection Time: 08/06/18  3:45 PM  Result Value Ref Range   Preg Test, Ur Negative Negative       Assessment and Plan:    1. Urinary retention with incomplete bladder emptying Evaluation done to rule out UTI, also check renal function tests. given persistent retention.   - Urine Culture - POCT urinalysis dipstick - POCT urine pregnancy - Comprehensive metabolic panel - CBC w/Diff  2. Enlarged uterus Concerned about size of uterus and effect on bladder. CT scan ordered to help with seeing the spatial effect of the enlarged uterus on her bladder and surrounding organs. Had normal pap a year ago, cervix  palpated normally. Urine HCG negative today.  Will follow up results and manage accordingly. - CT ABDOMEN PELVIS W CONTRAST; Future Routine preventative health maintenance measures emphasized. Please refer to After Visit Summary for other counseling recommendations.   Return for any gynecologic concerns.    Total face-to-face time with patient: 30 minutes.  Over 50% of encounter was spent on counseling and coordination of care.   Amy Schneiders, MD, [place] for [employer], [place]

## 2018-08-07 LAB — COMPREHENSIVE METABOLIC PANEL
ALT: 15 IU/L (ref 0–32)
AST: 16 IU/L (ref 0–40)
Albumin/Globulin Ratio: 1.7 (ref 1.2–2.2)
Albumin: 4.4 g/dL (ref 3.8–4.8)
Alkaline Phosphatase: 64 IU/L (ref 39–117)
BUN/Creatinine Ratio: 13 (ref 9–23)
BUN: 11 mg/dL (ref 6–20)
Bilirubin Total: 0.2 mg/dL (ref 0.0–1.2)
CO2: 22 mmol/L (ref 20–29)
Calcium: 9.4 mg/dL (ref 8.7–10.2)
Chloride: 102 mmol/L (ref 96–106)
Creatinine, Ser: 0.86 mg/dL (ref 0.57–1.00)
GFR calc Af Amer: 104 mL/min/{1.73_m2} (ref >59)
GFR calc non Af Amer: 90 mL/min/{1.73_m2} (ref >59)
Globulin, Total: 2.6 g/dL (ref 1.5–4.5)
Glucose: 87 mg/dL (ref 65–99)
Potassium: 4.1 mmol/L (ref 3.5–5.2)
Sodium: 138 mmol/L (ref 134–144)
Total Protein: 7 g/dL (ref 6.0–8.5)

## 2018-08-07 LAB — CBC WITH DIFFERENTIAL/PLATELET
Basophils Absolute: 0 10*3/uL (ref 0.0–0.2)
Basos: 0 %
EOS (ABSOLUTE): 0 10*3/uL (ref 0.0–0.4)
Eos: 1 %
Hematocrit: 41.6 % (ref 34.0–46.6)
Hemoglobin: 14.6 g/dL (ref 11.1–15.9)
Immature Grans (Abs): 0 10*3/uL (ref 0.0–0.1)
Immature Granulocytes: 0 %
Lymphocytes Absolute: 1.3 10*3/uL (ref 0.7–3.1)
Lymphs: 27 %
MCH: 32 pg (ref 26.6–33.0)
MCHC: 35.1 g/dL (ref 31.5–35.7)
MCV: 91 fL (ref 79–97)
Monocytes Absolute: 0.3 10*3/uL (ref 0.1–0.9)
Monocytes: 7 %
Neutrophils Absolute: 3 10*3/uL (ref 1.4–7.0)
Neutrophils: 65 %
Platelets: 265 10*3/uL (ref 150–450)
RBC: 4.56 x10E6/uL (ref 3.77–5.28)
RDW: 12.2 % (ref 11.7–15.4)
WBC: 4.7 10*3/uL (ref 3.4–10.8)

## 2018-08-07 LAB — URINE CULTURE

## 2018-08-12 ENCOUNTER — Ambulatory Visit
Admission: RE | Admit: 2018-08-12 | Discharge: 2018-08-12 | Disposition: A | Discharge status: Home / Self Care | Payer: BLUE CROSS/BLUE SHIELD | Source: Ambulatory Visit | Attending: Obstetrics & Gynecology | Admitting: Obstetrics & Gynecology

## 2018-08-12 ENCOUNTER — Ambulatory Visit: Admission: RE | Admit: 2018-08-12 | Payer: BLUE CROSS/BLUE SHIELD | Source: Ambulatory Visit

## 2018-08-12 ENCOUNTER — Other Ambulatory Visit: Payer: Self-pay

## 2018-08-12 DIAGNOSIS — N200 Calculus of kidney: Secondary | ICD-10-CM | POA: Diagnosis not present

## 2018-08-12 DIAGNOSIS — N852 Hypertrophy of uterus: Secondary | ICD-10-CM | POA: Diagnosis not present

## 2018-08-12 MED ORDER — IOHEXOL 300 MG/ML  SOLN
100.0000 mL | Freq: Once | INTRAMUSCULAR | Status: AC | PRN
  Administered 2018-08-12: 100 mL via INTRAVENOUS

## 2018-08-13 NOTE — Progress Notes (Signed)
Patient called with results of CT scan; she likely has benign uterine leiomyomata/fibroids.  Discussed different management options for fibroids given her symptoms: Surgery (Myomectomy/Hysterectomy) vs Uterine Artery Embolization vs Depo Lupron therapy to reduce size of fibroids. Risks and benefits discussed in detail, all questions answered.  Patient reported she does not desire future fertility.  She will research these options and let us know of what she wants to do; did discuss limitations of scheduling procedures during this pandemic.  Also concerned about size of fibroids, she does not report previous history of these, concerned about possible rapid growth given recent onset of symptoms. Discussed rare possibility of leiomyosarcoma and its implication, no validated screening modality for this as it is a pathology diagnosis.  Patient will follow up, may need virtual visit to discuss these options later.  Verita Schneiders, MD

## 2018-08-14 ENCOUNTER — Encounter: Payer: Self-pay | Admitting: Obstetrics & Gynecology

## 2018-08-14 DIAGNOSIS — N858 Other specified noninflammatory disorders of uterus: Secondary | ICD-10-CM

## 2018-08-14 DIAGNOSIS — N852 Hypertrophy of uterus: Secondary | ICD-10-CM

## 2018-08-14 DIAGNOSIS — R339 Retention of urine, unspecified: Secondary | ICD-10-CM | POA: Insufficient documentation

## 2018-08-14 HISTORY — DX: Other specified noninflammatory disorders of uterus: N85.8

## 2018-08-14 HISTORY — DX: Hypertrophy of uterus: N85.2

## 2018-08-15 NOTE — Telephone Encounter (Signed)
Faculty Practice OB/GYN Physician Phone Call Documentation  I received called Amy Schneider back and answered her questions about uterine fibroid embolization (UFE).  She desires a referral to Interventional Radiology at [facility]; this order was placed.  Patient will be contacted by our office staff about this referral sometime next week. Of note, also answered questions about hysterectomy and myomectomy.  Patient is considering all her options at this point, but leaning towards UFE.   Verita Schneiders, MD, [place] for [employer], [employer]

## 2018-08-19 ENCOUNTER — Other Ambulatory Visit: Payer: Self-pay | Admitting: Obstetrics & Gynecology

## 2018-08-19 ENCOUNTER — Other Ambulatory Visit: Payer: Self-pay | Admitting: Family Medicine

## 2018-08-19 DIAGNOSIS — D250 Submucous leiomyoma of uterus: Secondary | ICD-10-CM

## 2018-08-20 ENCOUNTER — Ambulatory Visit
Admission: RE | Admit: 2018-08-20 | Discharge: 2018-08-20 | Disposition: A | Discharge status: Home / Self Care | Payer: BLUE CROSS/BLUE SHIELD | Source: Ambulatory Visit | Attending: Obstetrics & Gynecology | Admitting: Obstetrics & Gynecology

## 2018-08-20 ENCOUNTER — Encounter: Payer: Self-pay | Admitting: *Deleted

## 2018-08-20 ENCOUNTER — Other Ambulatory Visit: Payer: Self-pay

## 2018-08-20 DIAGNOSIS — D250 Submucous leiomyoma of uterus: Secondary | ICD-10-CM

## 2018-08-20 DIAGNOSIS — D259 Leiomyoma of uterus, unspecified: Secondary | ICD-10-CM | POA: Diagnosis not present

## 2018-08-20 DIAGNOSIS — N852 Hypertrophy of uterus: Secondary | ICD-10-CM | POA: Diagnosis not present

## 2018-08-20 HISTORY — PX: IR RADIOLOGIST EVAL & MGMT: IMG5224

## 2018-08-20 NOTE — Consult Note (Signed)
Chief Complaint: Patient was consulted remotely today (TeleHealth) for uterine fibroids at the request of Anyanwu,Ugonna A.    Referring Physician(s): Anyanwu,Ugonna A  History of Present Illness: Amy Schneider is a 33 y.o. female with history of urinary retention and enlarged uterus with fibroids.  Patient has been evaluated by urology and gynecology.  Recent CT demonstrated a large uterus with fibroids.  Enlarged uterus is compressing the urinary bladder and thought to be related to her urinary symptoms and retention.  Patient has never been pregnant and she has no plans to get pregnant.  She takes birth control pills because she has problems with ovarian cysts.  She does not have regular menstrual bleeding due to her hormone therapy.  However, she did have heavy flow and clots when she was off the hormonal therapy earlier in the year. Along with the urinary retention, she complains of constipation.  She denies any significant pelvic infections.  Complains of pelvic distention and fullness at times.  Past Medical History:  Diagnosis Date   Bilateral ovarian cysts    GAD (generalized anxiety disorder)    Hypothyroidism    Panic attacks     Past Surgical History:  Procedure Laterality Date   IR RADIOLOGIST EVAL & MGMT  08/20/2018    Allergies: Hydrocodone-acetaminophen  Medications: Prior to Admission medications   Medication Sig Start Date End Date Taking? Authorizing Provider  drospirenone-ethinyl estradiol (SYEDA) 3-0.03 MG tablet 1 tablet once daily 07/12/17   [provider]  propranolol (INDERAL) 80 MG tablet propranolol 80 mg tablet  Take 1 tablet daily as needed forpanic 08/13/17   [provider]  SYNTHROID 25 MCG tablet Take 12.5 mcg by mouth daily before breakfast.    [provider]     Family History  Problem Relation Age of Onset   Alcohol abuse Mother    Depression Mother    Early death Mother    Depression Sister      Diabetes Maternal Grandmother    Heart attack Maternal Grandmother    Heart disease Maternal Grandmother    Stroke Maternal Grandmother    Parkinson's disease Maternal Grandfather    Breast cancer Paternal Grandmother     Social History   Socioeconomic History   Marital status: Married    Spouse name: Not on file   Number of children: Not on file   Years of education: Not on file   Highest education level: Not on file  Occupational History   Not on file  Social Needs   Financial resource strain: Not on file   Food insecurity:    Worry: Not on file    Inability: Not on file   Transportation needs:    Medical: Not on file    Non-medical: Not on file  Tobacco Use   Smoking status: Never Smoker   Smokeless tobacco: Never Used  Substance and Sexual Activity   Alcohol use: Not Currently   Drug use: Not on file   Sexual activity: Not on file  Lifestyle   Physical activity:    Days per week: Not on file    Minutes per session: Not on file   Stress: Not on file  Relationships   Social connections:    Talks on phone: Not on file    Gets together: Not on file    Attends religious service: Not on file    Active member of club or organization: Not on file    Attends meetings of clubs or organizations:  Not on file    Relationship status: Not on file  Other Topics Concern   Not on file  Social History Narrative   Married.   Works as a Theatre manager.      Review of Systems  Genitourinary: Positive for difficulty urinating and menstrual problem.     Physical Exam No direct physical exam was performed (except for noted visual exam findings with Video Visits).    Vital Signs: LMP 08/12/2018   Imaging: Ct Abdomen Pelvis W Contrast  Result Date: 08/13/2018 CLINICAL DATA:  Intermittent inability to urinate for 1 year. EXAM: CT ABDOMEN AND PELVIS WITH CONTRAST TECHNIQUE: Multidetector CT imaging of the abdomen and pelvis was performed using the  standard protocol following bolus administration of intravenous contrast. CONTRAST:  157mL OMNIPAQUE IOHEXOL 300 MG/ML  SOLN COMPARISON:  None. FINDINGS: Lower chest: No acute abnormality. Hepatobiliary: No focal liver abnormality is seen. No gallstones, gallbladder wall thickening, or biliary dilatation. Pancreas: Unremarkable. No pancreatic ductal dilatation or surrounding inflammatory changes. Spleen: Normal in size without focal abnormality. Adrenals/Urinary Tract: Adrenal glands are unremarkable. The right kidney is normal. There are 1 mm nonobstructing stones in the lower pole left kidney. No hydronephrosis is identified bilaterally. Bladder is unremarkable. Stomach/Bowel: Stomach is within normal limits. Appendix appears normal. No evidence of bowel wall thickening, distention, or inflammatory changes. Vascular/Lymphatic: Aortic atherosclerosis. No enlarged abdominal or pelvic lymph nodes. Reproductive: The uterus is enlarged with multiple relatively homogeneously enhancing masses, largest mass measures 7.6 x 6.3 x 8.2 cm. The uterus measures 11.3 x 12.7 x 11.5 cm. The enlarged uterus causes mass effect on the bladder anteriorly and mass effect on the rectal sigmoid colon posteriorly. The ovaries are normal. Other: None. Musculoskeletal: No acute or significant osseous findings. IMPRESSION: Enlarged uterus with multiple relatively homogeneously enhancing masses. These are probably uterine fibroids. The uterus measures 11.3 x 12.7 x 11.5 cm in size. The enlarged uterus causes mass effect on the bladder anteriorly and mass effect on the rectal sigmoid colon posteriorly. Electronically Signed   By: Abelardo Diesel M.D.   On: 08/13/2018 07:24   Ir Radiologist Eval & Mgmt  Result Date: 08/20/2018 Please refer to notes tab for details about interventional procedure. (Op Note)   Labs:  CBC: Recent Labs    01/28/18 1037 08/06/18 1443  WBC 4.0 4.7  HGB 14.2 14.6  HCT 40.9 41.6  PLT 242.0 265     COAGS: No results for input(s): INR, APTT in the last 8760 hours.  BMP: Recent Labs    01/28/18 1037 08/06/18 1443  NA 137 138  K 4.2 4.1  CL 106 102  CO2 27 22  GLUCOSE 85 87  BUN 12 11  CALCIUM 9.2 9.4  CREATININE 0.79 0.86  GFRNONAA  --  90  GFRAA  --  104    LIVER FUNCTION TESTS: Recent Labs    01/28/18 1037 08/06/18 1443  BILITOT 0.4 0.2  AST 12 16  ALT 10 15  ALKPHOS 45 64  PROT 7.1 7.0  ALBUMIN 4.1 4.4    TUMOR MARKERS: No results for input(s): AFPTM, CEA, CA199, CHROMGRNA in the last 8760 hours.  Assessment and Plan:  33 year old female with an enlarged uterus, fibroids and urinary retention.  I reviewed the CT of the abdomen and pelvis from 08/12/2018.  Along with multiple large fibroids, the uterus appears to be retroflexed and there is compression on both the urinary bladder and rectum.  I do believe that the urinary symptoms could be associated  from the enlarged uterus and fibroids.  We discussed treatment options for uterine fibroids including hormonal therapy, hysterectomy, myomectomy and uterine artery embolization.  We discussed the uterine artery embolization procedure in depth.  We discussed how the procedure is performed in the hospital with moderate sedation and usually requires overnight observation for symptom management.  Patient understands that the recovery is usually 1 to 2 weeks following the procedure until people can return to work and full-time activities.  Explained that the uterine artery embolization should be effective at decreasing the size of both the uterus and the fibroids but it is difficult to predict how the bulk symptoms will respond over time.  It can take 6 to 12 months for the uterus and fibroids to fully decrease in size.  Fibroids typically decreased 40 to 50% in size following embolization but again not clear how these will affect her bulk symptoms.  I suspect there will be decreased compression on both the uterus and rectum  following uterine artery embolization.  I believe that the patient has a very good understanding of the procedure itself and the post procedure expectations.  Patient would like to continue to research the treatment options.  Explained that we would have a better understanding of the fibroid burden and location if we got a pelvic MRI, with and without contrast.  Patient will contact the office if she would like to get additional information or schedule a pelvic MRI.  Consultation was performed with audio only because video was not available.   Thank you for this interesting consult.  I greatly enjoyed meeting Amy Schneider and look forward to participating in their care.  A copy of this report was sent to the requesting provider on this date.  Electronically Signed: Burman Riis 08/20/2018, 2:02 PM   I spent a total of  15 Minutes   in remote  clinical consultation, greater than 50% of which was counseling/coordinating care for uterine fibroids.    Visit type: Audio only (telephone). Audio (no video) only due to lack of video capability in office.. Alternative for in-person consultation at [facility], [address] [place], [place], [place]. This visit type was conducted due to national recommendations for restrictions regarding the COVID-19 Pandemic (e.g. social distancing).  This format is felt to be most appropriate for this patient at this time.  All issues noted in this document were discussed and addressed.

## 2018-08-24 ENCOUNTER — Other Ambulatory Visit: Payer: Self-pay

## 2018-08-24 MED ORDER — FLUCONAZOLE 150 MG PO TABS: 150.0000 mg | ORAL_TABLET | Freq: Once | ORAL | 0 refills | Status: AC

## 2018-08-24 NOTE — Telephone Encounter (Signed)
Patient would like a refill on diflucan. She thinks she has yeast infection.

## 2018-09-15 ENCOUNTER — Ambulatory Visit (INDEPENDENT_AMBULATORY_CARE_PROVIDER_SITE_OTHER): Payer: BC Managed Care – PPO | Admitting: Obstetrics & Gynecology

## 2018-09-15 ENCOUNTER — Other Ambulatory Visit: Payer: Self-pay

## 2018-09-15 ENCOUNTER — Encounter: Payer: Self-pay | Admitting: Obstetrics & Gynecology

## 2018-09-15 VITALS — BP 123/81 | HR 80 | Ht 64.0 in | Wt 143.0 lb

## 2018-09-15 DIAGNOSIS — N852 Hypertrophy of uterus: Secondary | ICD-10-CM

## 2018-09-15 DIAGNOSIS — D259 Leiomyoma of uterus, unspecified: Secondary | ICD-10-CM

## 2018-09-15 NOTE — Progress Notes (Signed)
GYNECOLOGY OFFICE VISIT NOTE  History:   Amy Schneider is a 33 y.o. G0P0000 here today for discussion about surgical management of her enlarged fibroid uterus.  Has continued pelvic pressure symptoms as discussed previously. Please see my previous notes for more details. Was seen by IR, not a candidate for UFE. She denies any abnormal vaginal discharge, bleeding or other concerns.    Past Medical History:  Diagnosis Date   Bilateral ovarian cysts    GAD (generalized anxiety disorder)    Hypothyroidism    Panic attacks     Past Surgical History:  Procedure Laterality Date   IR RADIOLOGIST EVAL & MGMT  08/20/2018    The following portions of the patient's history were reviewed and updated as appropriate: allergies, current medications, past family history, past medical history, past social history, past surgical history and problem list.   Health Maintenance:  Normal pap in 2019 as per patient.  Review of Systems:  Pertinent items noted in HPI and remainder of comprehensive ROS otherwise negative.  Physical Exam:  BP 123/81    Pulse 80    Ht 5\' 4"  (1.626 m)    Wt 143 lb (64.9 kg)    BMI 24.55 kg/m  CONSTITUTIONAL: Well-developed, well-nourished female in no acute distress.  HEENT:  Normocephalic, atraumatic. External right and left ear normal. No scleral icterus.  NECK: Normal range of motion, supple, no masses noted on observation SKIN: No rash noted. Not diaphoretic. No erythema. No pallor. MUSCULOSKELETAL: Normal range of motion. No edema noted. NEUROLOGIC: Alert and oriented to person, place, and time. Normal muscle tone coordination. No cranial nerve deficit noted. PSYCHIATRIC: Normal mood and affect. Normal behavior. Normal judgment and thought content. CARDIOVASCULAR: Normal heart rate noted RESPIRATORY: Effort and breath sounds normal, no problems with respiration noted ABDOMEN: Enlarged uterus palpated, no other masses.  No other overt distention noted.     PELVIC: Deferred  Imaging Ct Abdomen Pelvis W Contrast  Result Date: 08/13/2018 CLINICAL DATA:  Intermittent inability to urinate for 1 year. EXAM: CT ABDOMEN AND PELVIS WITH CONTRAST TECHNIQUE: Multidetector CT imaging of the abdomen and pelvis was performed using the standard protocol following bolus administration of intravenous contrast. CONTRAST:  130mL OMNIPAQUE IOHEXOL 300 MG/ML  SOLN COMPARISON:  None. FINDINGS: Lower chest: No acute abnormality. Hepatobiliary: No focal liver abnormality is seen. No gallstones, gallbladder wall thickening, or biliary dilatation. Pancreas: Unremarkable. No pancreatic ductal dilatation or surrounding inflammatory changes. Spleen: Normal in size without focal abnormality. Adrenals/Urinary Tract: Adrenal glands are unremarkable. The right kidney is normal. There are 1 mm nonobstructing stones in the lower pole left kidney. No hydronephrosis is identified bilaterally. Bladder is unremarkable. Stomach/Bowel: Stomach is within normal limits. Appendix appears normal. No evidence of bowel wall thickening, distention, or inflammatory changes. Vascular/Lymphatic: Aortic atherosclerosis. No enlarged abdominal or pelvic lymph nodes. Reproductive: The uterus is enlarged with multiple relatively homogeneously enhancing masses, largest mass measures 7.6 x 6.3 x 8.2 cm. The uterus measures 11.3 x 12.7 x 11.5 cm. The enlarged uterus causes mass effect on the bladder anteriorly and mass effect on the rectal sigmoid colon posteriorly. The ovaries are normal. Other: None. Musculoskeletal: No acute or significant osseous findings. IMPRESSION: Enlarged uterus with multiple relatively homogeneously enhancing masses. These are probably uterine fibroids. The uterus measures 11.3 x 12.7 x 11.5 cm in size. The enlarged uterus causes mass effect on the bladder anteriorly and mass effect on the rectal sigmoid colon posteriorly. Electronically Signed   By: Seward Meth  Augustin Coupe M.D.   On: 08/13/2018 07:24    Ir Radiologist Eval & Mgmt  Result Date: 08/20/2018 Please refer to notes tab for details about interventional procedure. (Op Note)      Assessment and Plan:    Bulky and enlarged fibroid uterus Results and images reviewed with patient. Patient has multiple fibroids in different locations in the uterus, pointed this out to her. Given her symptoms and concerns about pelvic support/sexual function, discussed the different surgical modalities: total hysterectomy, subtotal/supracervical hysterectomy and myomectomy. She does not want future fertility, and was advised against myomectomy given the multiple fibroids, possible need for several uterine incisions, more risk of bleeding, risk of conversion to hysterectomy, and increased adhesive disease in the long term.  After detailed discussion and answering all of her questions, patient desires definitive management with supracervical abdominal hysterectomy [facility]) and prophylactic bilateral salpingectomy.  No indication for oophorectomy.  The risks of surgery were discussed in detail with the patient including but not limited to: bleeding which may require transfusion or reoperation; infection which may require antibiotics; injury to bowel, bladder, ureters or other surrounding organs; need for additional procedures including laparotomy; thromboembolic phenomenon, incisional problems and other postoperative/anesthesia complications.  Patient was also advised that she will remain in house for 1-2 nights; and expected recovery time after a hysterectomy is 6-8 weeks.  Likelihood of success in alleviating the patient's symptoms was discussed.   She was told that she will be contacted by our surgical scheduler regarding the time and date of her surgery; routine preoperative instructions of having nothing to eat or drink after midnight on the day prior to surgery and also coming to the hospital 1.5 hours prior to her time of surgery were also emphasized.  She was told  she may be called for a preoperative appointment about a week prior to surgery and will be given further preoperative instructions at that visit. Also emphasized that preprocedural COVID testing will be done and emphasized need for quarantine from the time of the test to time of surgery.  I also suggested preoperative Lupron treatment which could help in reducing size of uterus and reduce blood loss during surgery, risks and benefits discussed. She will consider this, but does not seem she would go for this.  Routine postoperative instructions will be reviewed with the patient and her family in detail after surgery. Printed patient education handouts about the procedure was given to the patient to review at home.  There is a possibility that patient may get a second opinion and surgery could be done by another group; she was told that we are scheduling cases in September/October right now given our COVID-associated backlog.  If she is able to get in faster elsewhere, she will inform us.   Routine preventative health maintenance measures emphasized. Please refer to After Visit Summary for other counseling recommendations.   Return for any gynecologic concerns.    Total face-to-face time with patient: 25 minutes.  Over 50% of encounter was spent on counseling and coordination of care.   Verita Schneiders, MD, [place] for [employer], [place]

## 2018-09-15 NOTE — Patient Instructions (Signed)
[facility]   Supracervical Hysterectomy A supracervical hysterectomy (also called subtotal hysterectomy) is a surgery to remove the top part of the uterus while leaving the cervix in place. It may be done to treat these problems:  Fibroid tumors.  Endometriosis.  Certain types of uterine cancer. This surgery is usually done using a technique called laparoscopy. The technique is minimally invasive, which means that it is done through small incisions instead of a large incision. The result is less pain, lower risk of infection, and shorter recovery time. The surgery is sometimes done with a procedure to remove the fallopian tubes and ovaries (bilateral salpingo-oophorectomy). After the surgery, menstrual periods will stop and it will no longer be possible to get pregnant. Tell a health care provider about:  Any allergies you have.  All medicines you are taking, including vitamins, herbs, eye drops, creams, and over-the-counter medicines.  Any problems you or family members have had with anesthetic medicines.  Any blood disorders you have.  Any surgeries you have had.  Any medical conditions you have. What are the risks? Generally, this is a safe procedure. However, problems may occur, including:  Bleeding.  Blood clots in the legs or lung.  Infection.  Changing from small incisions to open abdominal surgery.  Additional surgery later to remove the cervix, if you have problems with the cervix.  Allergic reactions to medicines or dyes.  Damage to other structures or organs. What happens before the procedure? Staying hydrated Follow instructions from your health care provider about hydration, which may include:  Up to 2 hours before the procedure - you may continue to drink clear liquids, such as water, clear fruit juice, black coffee, and plain tea. Eating and drinking restrictions Follow instructions from your health care provider about eating and  drinking, which may include:  8 hours before the procedure - stop eating heavy meals or foods such as meat, fried foods, or fatty foods.  6 hours before the procedure - stop eating light meals or foods, such as toast or cereal.  6 hours before the procedure - stop drinking milk or drinks that contain milk.  2 hours before the procedure - stop drinking clear liquids. Medicine Ask your health care provider about:  Changing or stopping your regular medicines. This is especially important if you are taking diabetes medicines or blood thinners.  Taking over-the-counter medicines, vitamins, herbs, and supplements.  Taking medicines such as aspirin and ibuprofen. These medicines can thin your blood. Do not take these medicines unless your health care provider tells you to take them. General instructions  Do not use any products that contain nicotine or tobacco, such as cigarettes and e-cigarettes. If you need help quitting, ask your health care provider.  Ask your health care provider how your surgical site will be marked or identified.  To reduce your risk for infection, you may be asked to shower with a germ-killing soap.  Plan to have someone take you home from the hospital or clinic. What happens during the procedure?  To lower your risk of infection: ? Your health care team will wash or sanitize their hands. ? Hair may be removed from the surgical area. ? Your skin will be washed with soap.  An IV tube will be inserted into one of your veins.  You will be given one or more of the following: ? A medicine to help you relax (sedative). ? A medicine to make you fall asleep (general anesthetic).  You will be given  an antibiotic medicine.  A gas will be used to inflate your abdomen. It will allow your surgeon to look inside your abdomen and perform the surgery.  Three or four small incisions will be made in your abdomen. One of these incisions will be made in the area of your belly  button.  A thin, flexible tube with a light and camera (laparoscope) will be inserted into an incision. The camera on the laparoscope will send pictures to a TV screen in the operating room, making it easy for the surgeon to have a good view of your abdomen.  Surgical instruments will be inserted through the other incisions.  The uterus will be cut into small pieces and removed through the incisions.  Your incisions will be closed with stitches (sutures) or steri-strips The procedure may vary among health care providers and hospitals. What happens after the procedure?  Your blood pressure, heart rate, breathing rate, and blood oxygen level will be monitored until the medicines you were given have worn off.  You may have some discomfort, tenderness, swelling, and bruising at the surgical site.  You will be given pain medicine for use in the hospital and at home after you are discharged.  Do not drive if you were given a sedative during the procedure. Summary  A supracervical hysterectomy is a surgery to remove the top part of the uterus while leaving the cervix in place.  This surgery is usually done using a minimally invasive technique, which means it is done through small incisions instead of a large incision.  The result is less pain, lower risk of infection, and shorter recovery time. This information is not intended to replace advice given to you by your health care provider. Make sure you discuss any questions you have with your health care provider. Document Released: 08/28/2007 Document Revised: 06/06/2016 Document Reviewed: 06/06/2016 Elsevier Interactive Patient Education  2019 [employer].

## 2018-09-24 ENCOUNTER — Institutional Professional Consult (permissible substitution): Payer: BLUE CROSS/BLUE SHIELD | Admitting: Obstetrics & Gynecology

## 2018-09-24 DIAGNOSIS — D251 Intramural leiomyoma of uterus: Secondary | ICD-10-CM | POA: Diagnosis not present

## 2018-09-24 DIAGNOSIS — R339 Retention of urine, unspecified: Secondary | ICD-10-CM | POA: Diagnosis not present

## 2018-09-24 DIAGNOSIS — Z124 Encounter for screening for malignant neoplasm of cervix: Secondary | ICD-10-CM | POA: Diagnosis not present

## 2018-09-24 DIAGNOSIS — D252 Subserosal leiomyoma of uterus: Secondary | ICD-10-CM | POA: Diagnosis not present

## 2018-09-24 DIAGNOSIS — Z1151 Encounter for screening for human papillomavirus (HPV): Secondary | ICD-10-CM | POA: Diagnosis not present

## 2018-09-24 DIAGNOSIS — Z793 Long term (current) use of hormonal contraceptives: Secondary | ICD-10-CM | POA: Diagnosis not present

## 2018-10-12 LAB — HM PAP SMEAR: HM Pap smear: NEGATIVE

## 2018-10-20 ENCOUNTER — Inpatient Hospital Stay: Admit: 2018-10-20 | Payer: BC Managed Care – PPO | Admitting: Obstetrics & Gynecology

## 2018-10-20 SURGERY — HYSTERECTOMY, SUPRACERVICAL, ABDOMINAL
Anesthesia: Choice | Laterality: Bilateral

## 2018-11-01 DIAGNOSIS — Z20828 Contact with and (suspected) exposure to other viral communicable diseases: Secondary | ICD-10-CM | POA: Diagnosis not present

## 2018-11-01 DIAGNOSIS — Z01812 Encounter for preprocedural laboratory examination: Secondary | ICD-10-CM | POA: Diagnosis not present

## 2018-11-02 HISTORY — PX: PARTIAL HYSTERECTOMY: SHX80

## 2018-11-03 DIAGNOSIS — D251 Intramural leiomyoma of uterus: Secondary | ICD-10-CM | POA: Diagnosis not present

## 2018-11-03 DIAGNOSIS — N838 Other noninflammatory disorders of ovary, fallopian tube and broad ligament: Secondary | ICD-10-CM | POA: Diagnosis not present

## 2018-11-03 DIAGNOSIS — D252 Subserosal leiomyoma of uterus: Secondary | ICD-10-CM | POA: Diagnosis not present

## 2018-11-03 DIAGNOSIS — E063 Autoimmune thyroiditis: Secondary | ICD-10-CM | POA: Diagnosis not present

## 2019-01-18 DIAGNOSIS — L820 Inflamed seborrheic keratosis: Secondary | ICD-10-CM | POA: Diagnosis not present

## 2019-01-18 DIAGNOSIS — L538 Other specified erythematous conditions: Secondary | ICD-10-CM | POA: Diagnosis not present

## 2019-06-08 DIAGNOSIS — R198 Other specified symptoms and signs involving the digestive system and abdomen: Secondary | ICD-10-CM | POA: Diagnosis not present

## 2019-06-08 DIAGNOSIS — N941 Unspecified dyspareunia: Secondary | ICD-10-CM | POA: Diagnosis not present

## 2019-06-08 DIAGNOSIS — R102 Pelvic and perineal pain: Secondary | ICD-10-CM | POA: Diagnosis not present

## 2019-06-15 DIAGNOSIS — R102 Pelvic and perineal pain: Secondary | ICD-10-CM | POA: Diagnosis not present

## 2019-06-29 DIAGNOSIS — Z23 Encounter for immunization: Secondary | ICD-10-CM | POA: Diagnosis not present

## 2019-07-01 ENCOUNTER — Encounter: Payer: Self-pay | Admitting: Primary Care

## 2019-07-01 ENCOUNTER — Other Ambulatory Visit: Payer: Self-pay

## 2019-07-01 ENCOUNTER — Ambulatory Visit: Payer: BC Managed Care – PPO | Admitting: Primary Care

## 2019-07-01 VITALS — BP 106/72 | HR 73 | Temp 96.4°F | Ht 64.0 in | Wt 142.2 lb

## 2019-07-01 DIAGNOSIS — R39198 Other difficulties with micturition: Secondary | ICD-10-CM | POA: Diagnosis not present

## 2019-07-01 DIAGNOSIS — E039 Hypothyroidism, unspecified: Secondary | ICD-10-CM | POA: Diagnosis not present

## 2019-07-01 DIAGNOSIS — F410 Panic disorder [episodic paroxysmal anxiety] without agoraphobia: Secondary | ICD-10-CM | POA: Diagnosis not present

## 2019-07-01 LAB — TSH: TSH: 2.84 u[IU]/mL (ref 0.35–4.50)

## 2019-07-01 MED ORDER — PROPRANOLOL HCL 80 MG PO TABS: ORAL_TABLET | ORAL | 0 refills | Status: DC

## 2019-07-01 NOTE — Progress Notes (Signed)
Subjective:    Patient ID: Amy Schneider, female    DOB: 13-Feb-1986, 34 y.o.   MRN: [id number]  HPI  This visit occurred during the SARS-CoV-2 public health emergency.  Safety protocols were in place, including screening questions prior to the visit, additional usage of staff PPE, and extensive cleaning of exam room while observing appropriate contact time as indicated for disinfecting solutions.   [name] is a 34 year old female with a history of hypothyroidism, uterine fibroids s/p hysterectomy 2020 who presents today for follow up.  She is currently prescribed Synthroid 12.5 mg. No recent TSH on file. Previously followed with an endocrinologist, was told that she has Hashimoto's. She is taking her Synthroid every morning on an empty stomach with water only. No food or other medications for 30 min. Symptoms of feeling jittery, fatigue, dry skin, cold feet intermittently for about one year.   She is managed on propranolol 80 mg PRN for anxiety/panic attacks. She is needing a refill. She does very well on this regimen.   Underwent abdominal hysterectomy last year due to numerous enlarged uterine fibroids that caused difficulty urinating. Doing better. May be going to pelvic floor PT.  BP Readings from Last 3 Encounters:  07/01/19 106/72  09/15/18 123/81  08/06/18 120/78     Review of Systems  Constitutional: Positive for fatigue.  Endocrine:       See HPI  Skin:       Dry skin  Psychiatric/Behavioral: The patient is not nervous/anxious.        Past Medical History:  Diagnosis Date  . Bilateral ovarian cysts   . GAD (generalized anxiety disorder)   . Hypothyroidism   . Panic attacks      Social History   Socioeconomic History  . Marital status: Married    Spouse name: Not on file  . Number of children: Not on file  . Years of education: Not on file  . Highest education level: Not on file  Occupational History  . Not on file  Tobacco Use  . Smoking  status: Never Smoker  . Smokeless tobacco: Never Used  Substance and Sexual Activity  . Alcohol use: Not Currently  . Drug use: Not on file  . Sexual activity: Not on file  Other Topics Concern  . Not on file  Social History Narrative   Married.   Works as a Theatre manager.   Scientist, physiological Strain:   . Difficulty of Paying Living Expenses:   Food Insecurity:   . Worried About Charity fundraiser in the Last Year:   . Arboriculturist in the Last Year:   Transportation Needs:   . Film/video editor (Medical):   Marland Kitchen Lack of Transportation (Non-Medical):   Physical Activity:   . Days of Exercise per Week:   . Minutes of Exercise per Session:   Stress:   . Feeling of Stress :   Social Connections:   . Frequency of Communication with Friends and Family:   . Frequency of Social Gatherings with Friends and Family:   . Attends Religious Services:   . Active Member of Clubs or Organizations:   . Attends Archivist Meetings:   Marland Kitchen Marital Status:   Intimate Partner Violence:   . Fear of Current or Ex-Partner:   . Emotionally Abused:   Marland Kitchen Physically Abused:   . Sexually Abused:     Past Surgical History:  Procedure Laterality Date  .  IR RADIOLOGIST EVAL & MGMT  08/20/2018    Family History  Problem Relation Age of Onset  . Alcohol abuse Mother   . Depression Mother   . Early death Mother   . Depression Sister   . Diabetes Maternal Grandmother   . Heart attack Maternal Grandmother   . Heart disease Maternal Grandmother   . Stroke Maternal Grandmother   . Parkinson's disease Maternal Grandfather   . Breast cancer Paternal Grandmother     Allergies  Allergen Reactions  . Hydrocodone-Acetaminophen Nausea And Vomiting    Current Outpatient Medications on File Prior to Visit  Medication Sig Dispense Refill  . norgestimate-ethinyl estradiol (ORTHO-CYCLEN) 0.25-35 MG-MCG tablet Take by mouth.    . SYNTHROID 25 MCG tablet Take  12.5 mcg by mouth daily before breakfast.     No current facility-administered medications on file prior to visit.    BP 106/72   Pulse 73   Temp (!) 96.4 F (35.8 C) (Temporal)   Ht 5\' 4"  (1.626 m)   Wt 142 lb 4 oz (64.5 kg)   SpO2 98%   BMI 24.42 kg/m    Objective:   Physical Exam  Constitutional: She appears well-nourished.  Neck: No thyromegaly present.  Cardiovascular: Normal rate and regular rhythm.  Respiratory: Effort normal and breath sounds normal.  Musculoskeletal:     Cervical back: Neck supple.  Skin: Skin is warm and dry.  Psychiatric: She has a normal mood and affect.           Assessment & Plan:

## 2019-07-01 NOTE — Assessment & Plan Note (Signed)
Doing well on PRN propranolol, refills provided.

## 2019-07-01 NOTE — Assessment & Plan Note (Signed)
Taking Synthroid correctly. Repeat TSH pending. Consider adjusting dose to 25 if needed given symptoms.

## 2019-07-01 NOTE — Patient Instructions (Signed)
Stop by the lab prior to leaving today. I will notify you of your results once received.   Be sure to take your levothyroxine (thyroid medication) every morning on an empty stomach with water only. No food or other medications for 30 minutes. No heartburn medication, iron pills, calcium, vitamin D, or magnesium pills within four hours of taking levothyroxine.   It was a pleasure to see you today!  

## 2019-07-01 NOTE — Assessment & Plan Note (Signed)
Improving since hysterectomy due to uterine fibroids. Will be going to pelvic floor PT.

## 2019-07-13 ENCOUNTER — Telehealth: Payer: Self-pay

## 2019-07-13 DIAGNOSIS — E039 Hypothyroidism, unspecified: Secondary | ICD-10-CM

## 2019-07-13 MED ORDER — SYNTHROID 25 MCG PO TABS: ORAL_TABLET | ORAL | 0 refills | Status: DC

## 2019-07-13 NOTE — Telephone Encounter (Signed)
Pt left v/m that she has not heard back about changing Synthroid 25 mcg from 1/2 tab to 1 full tab. Pt said pharmacy still has directions of 1/2 tab. Pt request cb. Please see lab result note on 07/01/19.

## 2019-07-13 NOTE — Telephone Encounter (Signed)
According to the comments on lab, patient is supposed to send a message to let Amy Schneider know if she is agreeable.  Patient stated that she though she already did. Patient is agreeable, please send script with new directions.

## 2019-07-13 NOTE — Telephone Encounter (Signed)
Noted, new Rx sent to pharmacy. Needs repeat lab appointment for thyroid testing in 2 months, please schedule.

## 2019-07-14 NOTE — Telephone Encounter (Signed)
I called patient to get this scheduled. She is wanting the orders to be sent to [facility].

## 2019-07-14 NOTE — Telephone Encounter (Signed)
Noted. Amy Schneider, we will have to release her labs to [facility] in 2 months. Do not want to release now as it is too soon.

## 2019-07-23 ENCOUNTER — Other Ambulatory Visit: Payer: Self-pay | Admitting: Primary Care

## 2019-07-23 DIAGNOSIS — F410 Panic disorder [episodic paroxysmal anxiety] without agoraphobia: Secondary | ICD-10-CM

## 2019-07-27 DIAGNOSIS — M62838 Other muscle spasm: Secondary | ICD-10-CM | POA: Diagnosis not present

## 2019-07-27 DIAGNOSIS — Z23 Encounter for immunization: Secondary | ICD-10-CM | POA: Diagnosis not present

## 2019-07-27 DIAGNOSIS — R102 Pelvic and perineal pain: Secondary | ICD-10-CM | POA: Diagnosis not present

## 2019-08-03 DIAGNOSIS — M62838 Other muscle spasm: Secondary | ICD-10-CM | POA: Diagnosis not present

## 2019-08-03 DIAGNOSIS — R102 Pelvic and perineal pain: Secondary | ICD-10-CM | POA: Diagnosis not present

## 2019-08-10 DIAGNOSIS — M62838 Other muscle spasm: Secondary | ICD-10-CM | POA: Diagnosis not present

## 2019-08-10 DIAGNOSIS — R102 Pelvic and perineal pain: Secondary | ICD-10-CM | POA: Diagnosis not present

## 2019-08-17 DIAGNOSIS — R102 Pelvic and perineal pain: Secondary | ICD-10-CM | POA: Diagnosis not present

## 2019-08-17 DIAGNOSIS — M62838 Other muscle spasm: Secondary | ICD-10-CM | POA: Diagnosis not present

## 2019-09-14 DIAGNOSIS — R102 Pelvic and perineal pain: Secondary | ICD-10-CM | POA: Diagnosis not present

## 2019-09-14 DIAGNOSIS — M62838 Other muscle spasm: Secondary | ICD-10-CM | POA: Diagnosis not present

## 2019-11-07 ENCOUNTER — Other Ambulatory Visit: Payer: Self-pay | Admitting: Primary Care

## 2019-11-07 DIAGNOSIS — E039 Hypothyroidism, unspecified: Secondary | ICD-10-CM

## 2020-01-24 HISTORY — PX: OTHER SURGICAL HISTORY: SHX169

## 2020-01-25 ENCOUNTER — Ambulatory Visit: Payer: BC Managed Care – PPO | Admitting: Primary Care

## 2020-01-25 ENCOUNTER — Other Ambulatory Visit: Payer: Self-pay

## 2020-01-25 ENCOUNTER — Encounter: Payer: Self-pay | Admitting: Primary Care

## 2020-01-25 VITALS — BP 108/62 | HR 84 | Temp 97.4°F | Ht 64.0 in | Wt 150.0 lb

## 2020-01-25 DIAGNOSIS — Z1159 Encounter for screening for other viral diseases: Secondary | ICD-10-CM | POA: Diagnosis not present

## 2020-01-25 DIAGNOSIS — Z114 Encounter for screening for human immunodeficiency virus [HIV]: Secondary | ICD-10-CM

## 2020-01-25 DIAGNOSIS — R002 Palpitations: Secondary | ICD-10-CM | POA: Diagnosis not present

## 2020-01-25 DIAGNOSIS — R232 Flushing: Secondary | ICD-10-CM | POA: Diagnosis not present

## 2020-01-25 DIAGNOSIS — E039 Hypothyroidism, unspecified: Secondary | ICD-10-CM | POA: Diagnosis not present

## 2020-01-25 LAB — CBC
HCT: 41.1 % (ref 36.0–46.0)
Hemoglobin: 14.1 g/dL (ref 12.0–15.0)
MCHC: 34.2 g/dL (ref 30.0–36.0)
MCV: 92.7 fl (ref 78.0–100.0)
Platelets: 224 10*3/uL (ref 150.0–400.0)
RBC: 4.43 Mil/uL (ref 3.87–5.11)
RDW: 12.4 % (ref 11.5–15.5)
WBC: 4.4 10*3/uL (ref 4.0–10.5)

## 2020-01-25 LAB — VITAMIN D 25 HYDROXY (VIT D DEFICIENCY, FRACTURES): VITD: 17.79 ng/mL — ABNORMAL LOW (ref 30.00–100.00)

## 2020-01-25 LAB — BASIC METABOLIC PANEL
BUN: 8 mg/dL (ref 6–23)
CO2: 25 mEq/L (ref 19–32)
Calcium: 8.8 mg/dL (ref 8.4–10.5)
Chloride: 107 mEq/L (ref 96–112)
Creatinine, Ser: 0.76 mg/dL (ref 0.40–1.20)
GFR: 103.5 mL/min (ref >60.00)
Glucose, Bld: 77 mg/dL (ref 70–99)
Potassium: 4.2 mEq/L (ref 3.5–5.1)
Sodium: 138 mEq/L (ref 135–145)

## 2020-01-25 LAB — VITAMIN B12: Vitamin B-12: 190 pg/mL — ABNORMAL LOW (ref 211–911)

## 2020-01-25 LAB — TSH: TSH: 3.64 u[IU]/mL (ref 0.35–4.50)

## 2020-01-25 NOTE — Progress Notes (Signed)
Subjective:    Patient ID: Amy Schneider, female    DOB: 12/21/1985, 34 y.o.   MRN: [id number]  HPI  This visit occurred during the SARS-CoV-2 public health emergency.  Safety protocols were in place, including screening questions prior to the visit, additional usage of staff PPE, and extensive cleaning of exam room while observing appropriate contact time as indicated for disinfecting solutions.   Amy Schneider is a 34 year old female with a history of hypothyroidism, palpitations who presents today with a chief complaint of hot flashes.  She is waking during the night with hot flashes, feels like her feet are burning. 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 She does have a history of insomnia with difficulty falling asleep prior to her hot flashes beginning.   Also with weight gain of 8 pounds since April 2021 despite healthy diet and regular exercise. She also reports palpitations at night.   She underwent hysterectomy in 2020 due to large uterine fibroids. She yet to contact her GYN regarding these symptoms.   She is taking her Synthroid every morning on an empty stomach with water only. No food or other medications for 30 minutes.   Review of Systems  Cardiovascular: Positive for palpitations.  Genitourinary:       Hot flashes  Musculoskeletal: Positive for arthralgias.  Psychiatric/Behavioral: Positive for sleep disturbance.       Past Medical History:  Diagnosis Date  . Bilateral ovarian cysts   . Bulky and enlarged uterus 08/14/2018   08/12/2018 CT ABDOMEN AND PELVIS WITH CONTRAST The uterus is enlarged with multiple relatively homogeneously enhancing masses, largest mass measures 7.6 x 6.3 x 8.2 cm. These are probably uterine fibroids. The uterus measures 11.3 x 12.7 x 11.5 cm. The enlarged uterus causes mass effect on the bladder anteriorly and mass effect on the rectal sigmoid colon posteriorly.   Result Notes recorded by Any  . GAD  (generalized anxiety disorder)   . Hypothyroidism   . Panic attacks   . Uterine masses 08/14/2018     Social History   Socioeconomic History  . Marital status: Married    Spouse name: Not on file  . Number of children: Not on file  . Years of education: Not on file  . Highest education level: Not on file  Occupational History  . Not on file  Tobacco Use  . Smoking status: Never Smoker  . Smokeless tobacco: Never Used  Substance and Sexual Activity  . Alcohol use: Not Currently  . Drug use: Not on file  . Sexual activity: Not on file  Other Topics Concern  . Not on file  Social History Narrative   Married.   Works as a Theatre manager.   Scientist, physiological Strain:   . Difficulty of Paying Living Expenses: Not on file  Food Insecurity:   . Worried About Charity fundraiser in the Last Year: Not on file  . Ran Out of Food in the Last Year: Not on file  Transportation Needs:   . Lack of Transportation (Medical): Not on file  . Lack of Transportation (Non-Medical): Not on file  Physical Activity:   . Days of Exercise per Week: Not on file  . Minutes of Exercise per Session: Not on file  Stress:   . Feeling of Stress : Not on file  Social Connections:   . Frequency of Communication with Friends and Family: Not  on file  . Frequency of Social Gatherings with Friends and Family: Not on file  . Attends Religious Services: Not on file  . Active Member of Clubs or Organizations: Not on file  . Attends Archivist Meetings: Not on file  . Marital Status: Not on file  Intimate Partner Violence:   . Fear of Current or Ex-Partner: Not on file  . Emotionally Abused: Not on file  . Physically Abused: Not on file  . Sexually Abused: Not on file    Past Surgical History:  Procedure Laterality Date  . IR RADIOLOGIST EVAL & MGMT  08/20/2018  . PARTIAL HYSTERECTOMY  11/02/2018    Family History  Problem Relation Age of Onset  . Alcohol  abuse Mother   . Depression Mother   . Early death Mother   . Depression Sister   . Diabetes Maternal Grandmother   . Heart attack Maternal Grandmother   . Heart disease Maternal Grandmother   . Stroke Maternal Grandmother   . Parkinson's disease Maternal Grandfather   . Breast cancer Paternal Grandmother     Allergies  Allergen Reactions  . Hydrocodone-Acetaminophen Nausea And Vomiting    Current Outpatient Medications on File Prior to Visit  Medication Sig Dispense Refill  . norgestimate-ethinyl estradiol (ORTHO-CYCLEN) 0.25-35 MG-MCG tablet Take by mouth.    . propranolol (INDERAL) 80 MG tablet TAKE 1 TABLET BY MOUTH EVERY DAY AS NEEDED FOR ANXIETY 30 tablet 0  . SYNTHROID 25 MCG tablet TAKE 1 TABLET BY MOUTH EVERY MORNING ON AN EMPTY STOMACH WITH WATER ONLY. NO FOOD OR OTHER MEDICATIONS FOR 30 MINUTES. 90 tablet 0   No current facility-administered medications on file prior to visit.    BP 108/62   Pulse 84   Temp (!) 97.4 F (36.3 C) (Temporal)   Ht 5\' 4"  (1.626 m)   Wt 150 lb (68 kg)   SpO2 100%   BMI 25.75 kg/m    Objective:   Physical Exam Cardiovascular:     Rate and Rhythm: Normal rate and regular rhythm.  Pulmonary:     Effort: Pulmonary effort is normal.     Breath sounds: Normal breath sounds.  Musculoskeletal:     Cervical back: Neck supple.  Skin:    General: Skin is warm and dry.  Neurological:     Mental Status: She is alert.  Psychiatric:        Mood and Affect: Mood normal.            Assessment & Plan:

## 2020-01-25 NOTE — Progress Notes (Signed)
Subjective:    Patient ID: Dahlia Bailiff, female    DOB: Jul 12, 1985, 34 y.o.   MRN: [id number]  HPI  This visit occurred during the SARS-CoV-2 public health emergency.  Safety protocols were in place, including screening questions prior to the visit, additional usage of staff PPE, and extensive cleaning of exam room while observing appropriate contact time as indicated for disinfecting solutions.   Mrs. Dobbin is a 34 year old female with a history of hypothyroidism, palpitations, panic attacks and s/p hysterectomy due to urine fibroids presents with a chief complaint of hot flashes and weight gain.   She is here today to discuss hot flashes and waking up at night drenched in sweat. She often has a burning sensation in bilateral feet that also occurs at night. She is also concerned due to weight gain despite a good diet and exercise she has gained 8lbs since April 2021. She is having palpitations mostly at night occurs once weekly. Denies any chest pain, shortness of breath or dizziness.    She is also having trouble falling asleep and then wakes up because she is hot. She wake sup early when her husband goes to work so she has been waking up feeling tired. She has tired melatonin which helped some.   She is currently prescribed synthroid 25 mcg for her hypothyroidism which she reports taking correctly first thing in the morning with nothing but water and at least 1 hour before any other medications.     Last TSH level of 2.84 April 2021   Wt Readings from Last 3 Encounters:  01/25/20 150 lb (68 kg)  07/01/19 142 lb 4 oz (64.5 kg)  09/15/18 143 lb (64.9 kg)     Increase in anxiety over the last few months.   Review of Systems  Respiratory: Negative for chest tightness.   Cardiovascular: Positive for palpitations.  Endocrine: Positive for heat intolerance.  Skin: Negative.   Neurological: Positive for headaches. Negative for dizziness and light-headedness.    BP Readings  from Last 3 Encounters:  01/25/20 108/62  07/01/19 106/72  09/15/18 123/81    Past Medical History:  Diagnosis Date  . Bilateral ovarian cysts   . Bulky and enlarged uterus 08/14/2018   08/12/2018 CT ABDOMEN AND PELVIS WITH CONTRAST The uterus is enlarged with multiple relatively homogeneously enhancing masses, largest mass measures 7.6 x 6.3 x 8.2 cm. These are probably uterine fibroids. The uterus measures 11.3 x 12.7 x 11.5 cm. The enlarged uterus causes mass effect on the bladder anteriorly and mass effect on the rectal sigmoid colon posteriorly.   Result Notes recorded by Any  . GAD (generalized anxiety disorder)   . Hypothyroidism   . Panic attacks   . Uterine masses 08/14/2018     Social History   Socioeconomic History  . Marital status: Married    Spouse name: Not on file  . Number of children: Not on file  . Years of education: Not on file  . Highest education level: Not on file  Occupational History  . Not on file  Tobacco Use  . Smoking status: Never Smoker  . Smokeless tobacco: Never Used  Substance and Sexual Activity  . Alcohol use: Not Currently  . Drug use: Not on file  . Sexual activity: Not on file  Other Topics Concern  . Not on file  Social History Narrative   Married.   Works as a Theatre manager.   Scientist, physiological Strain:   .  Difficulty of Paying Living Expenses: Not on file  Food Insecurity:   . Worried About Charity fundraiser in the Last Year: Not on file  . Ran Out of Food in the Last Year: Not on file  Transportation Needs:   . Lack of Transportation (Medical): Not on file  . Lack of Transportation (Non-Medical): Not on file  Physical Activity:   . Days of Exercise per Week: Not on file  . Minutes of Exercise per Session: Not on file  Stress:   . Feeling of Stress : Not on file  Social Connections:   . Frequency of Communication with Friends and Family: Not on file  . Frequency of Social Gatherings with  Friends and Family: Not on file  . Attends Religious Services: Not on file  . Active Member of Clubs or Organizations: Not on file  . Attends Archivist Meetings: Not on file  . Marital Status: Not on file  Intimate Partner Violence:   . Fear of Current or Ex-Partner: Not on file  . Emotionally Abused: Not on file  . Physically Abused: Not on file  . Sexually Abused: Not on file    Past Surgical History:  Procedure Laterality Date  . IR RADIOLOGIST EVAL & MGMT  08/20/2018  . PARTIAL HYSTERECTOMY  11/02/2018    Family History  Problem Relation Age of Onset  . Alcohol abuse Mother   . Depression Mother   . Early death Mother   . Depression Sister   . Diabetes Maternal Grandmother   . Heart attack Maternal Grandmother   . Heart disease Maternal Grandmother   . Stroke Maternal Grandmother   . Parkinson's disease Maternal Grandfather   . Breast cancer Paternal Grandmother     Allergies  Allergen Reactions  . Hydrocodone-Acetaminophen Nausea And Vomiting    Current Outpatient Medications on File Prior to Visit  Medication Sig Dispense Refill  . norgestimate-ethinyl estradiol (ORTHO-CYCLEN) 0.25-35 MG-MCG tablet Take by mouth.    . propranolol (INDERAL) 80 MG tablet TAKE 1 TABLET BY MOUTH EVERY DAY AS NEEDED FOR ANXIETY 30 tablet 0  . SYNTHROID 25 MCG tablet TAKE 1 TABLET BY MOUTH EVERY MORNING ON AN EMPTY STOMACH WITH WATER ONLY. NO FOOD OR OTHER MEDICATIONS FOR 30 MINUTES. 90 tablet 0   No current facility-administered medications on file prior to visit.    BP 108/62   Pulse 84   Temp (!) 97.4 F (36.3 C) (Temporal)   Ht 5\' 4"  (1.626 m)   Wt 150 lb (68 kg)   SpO2 100%   BMI 25.75 kg/m       Objective:   Physical Exam Constitutional:      Appearance: Normal appearance.  Cardiovascular:     Rate and Rhythm: Normal rate and regular rhythm.     Pulses: Normal pulses.     Heart sounds: Normal heart sounds.  Pulmonary:     Effort: Pulmonary effort is  normal.     Breath sounds: Normal breath sounds.  Musculoskeletal:     Cervical back: Normal range of motion and neck supple.  Skin:    General: Skin is warm and dry.     Capillary Refill: Capillary refill takes less than 2 seconds.  Neurological:     General: No focal deficit present.     Mental Status: She is alert and oriented to person, place, and time.           Assessment & Plan:

## 2020-01-25 NOTE — Assessment & Plan Note (Addendum)
Reoccurring mostly at night. Unclear exact cause does have history of anxiety as well as hypothyroidism. Exam unremarkable  Will rule out anemia, as well as check vitamin levels vitamin D and B12, CBC & Cmp.   Labs pending.        Agree with assessment and plan. Pleas Koch, NP

## 2020-01-25 NOTE — Assessment & Plan Note (Signed)
Taking Synthroid correctly. Suspect symptoms are more so from surgical menopause.  Repeat TSH pending.

## 2020-01-25 NOTE — Assessment & Plan Note (Addendum)
New onset over the last 2-3 months mostly happens at night. Unclear exact etiology, history of hypothyroidism and s/p hysterectomy will recheck TSH levels as well as CBC, CMP, B12 & vitamin D level.   She will update GYN provider as well.   Agree with assessment and plan. Pleas Koch, NP

## 2020-01-25 NOTE — Patient Instructions (Signed)
Stop by the lab prior to leaving today. I will notify you of your results once received.    Please update your gyn provider as well to keep them in the loop   Please update me via my chart with any changes.

## 2020-03-26 ENCOUNTER — Other Ambulatory Visit: Payer: Self-pay | Admitting: Primary Care

## 2020-03-26 DIAGNOSIS — E039 Hypothyroidism, unspecified: Secondary | ICD-10-CM

## 2020-04-20 IMAGING — MG DIGITAL DIAGNOSTIC BILATERAL MAMMOGRAM WITH TOMO AND CAD
6 of 10 series · 6 of 30 positions shown · non-contrast
Comparison: Previous exam(s).

CLINICAL DATA: Right upper central breast area of palpable concern
felt by the patient for approximately 2-3 months.

EXAM:
DIGITAL DIAGNOSTIC BILATERAL MAMMOGRAM WITH CAD AND TOMO
ULTRASOUND RIGHT BREAST

[L MLO synth-2D]
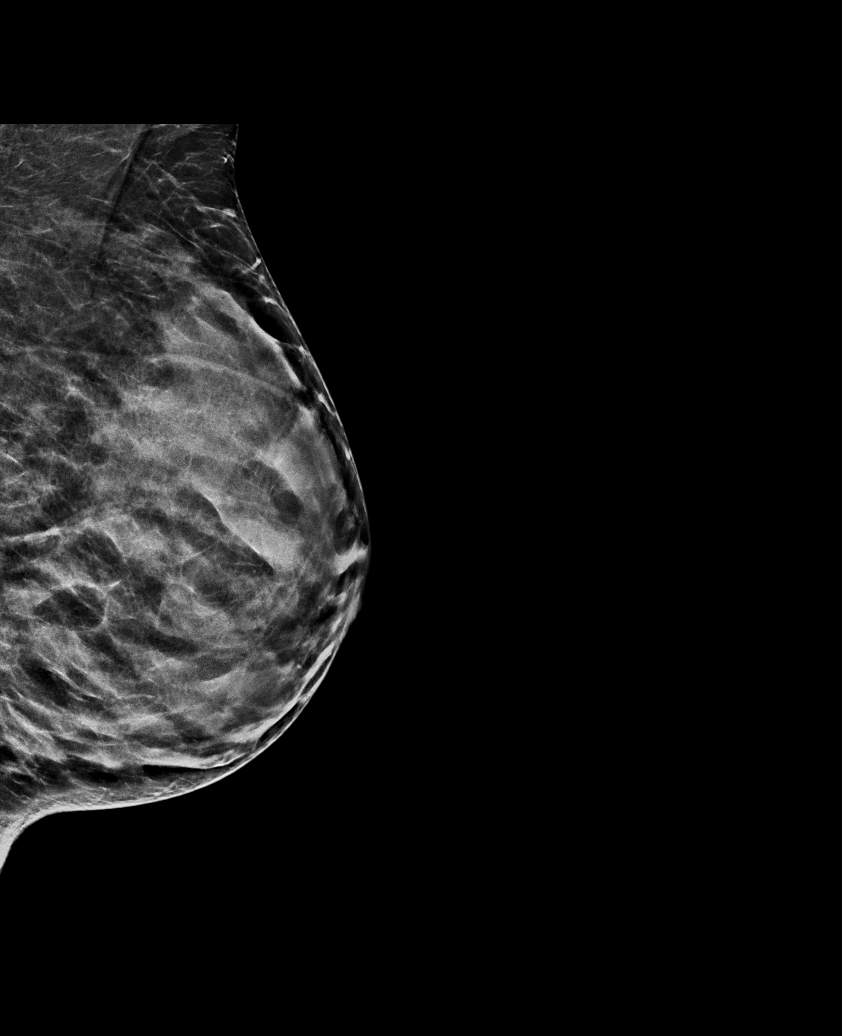

[L CC synth-2D]
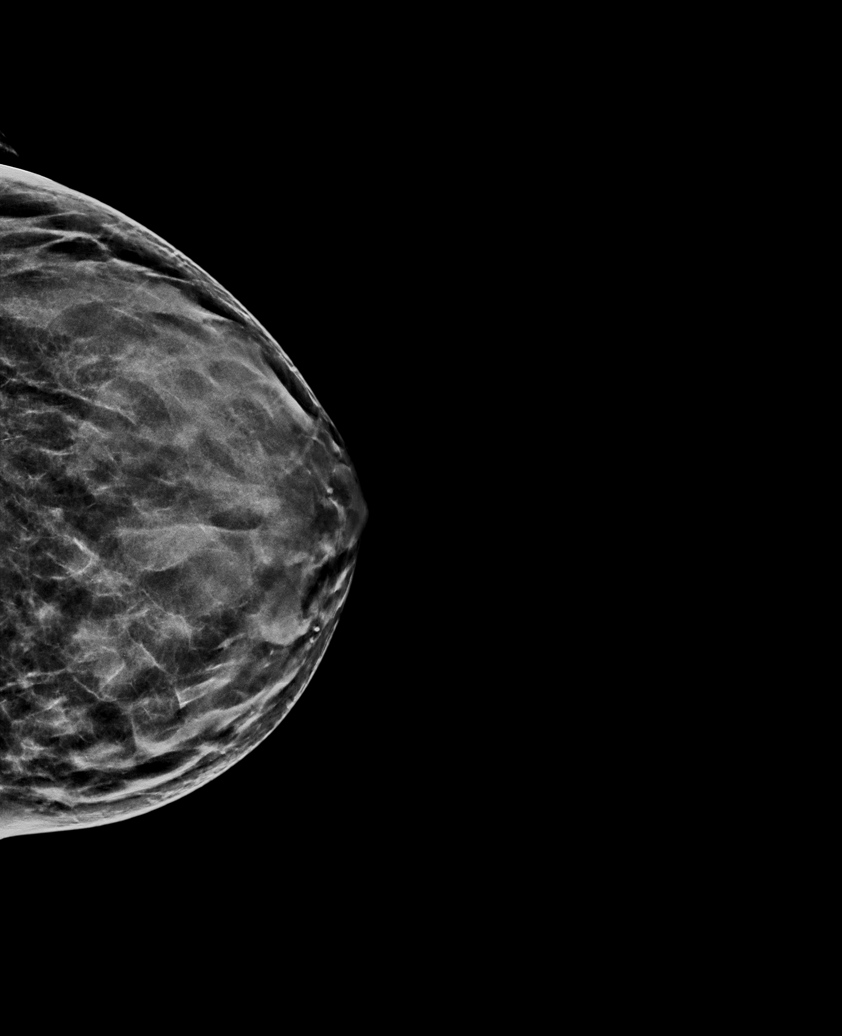

[R MLO synth-2D]
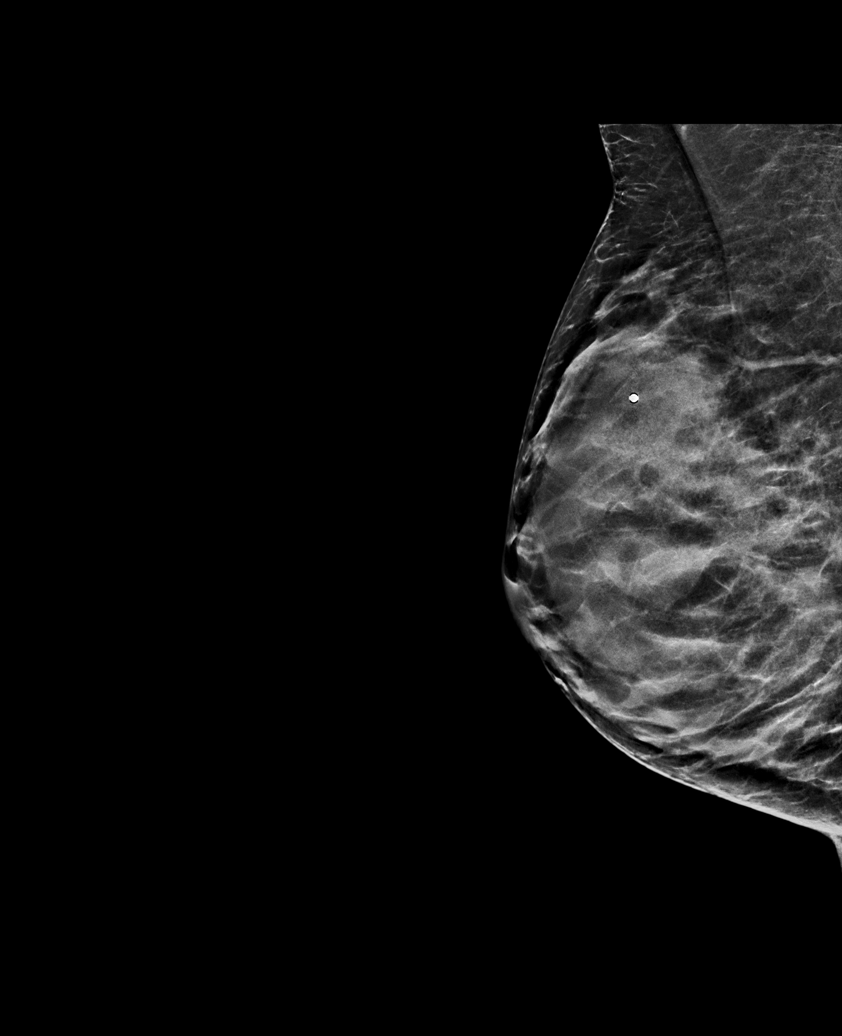

[R CC synth-2D]
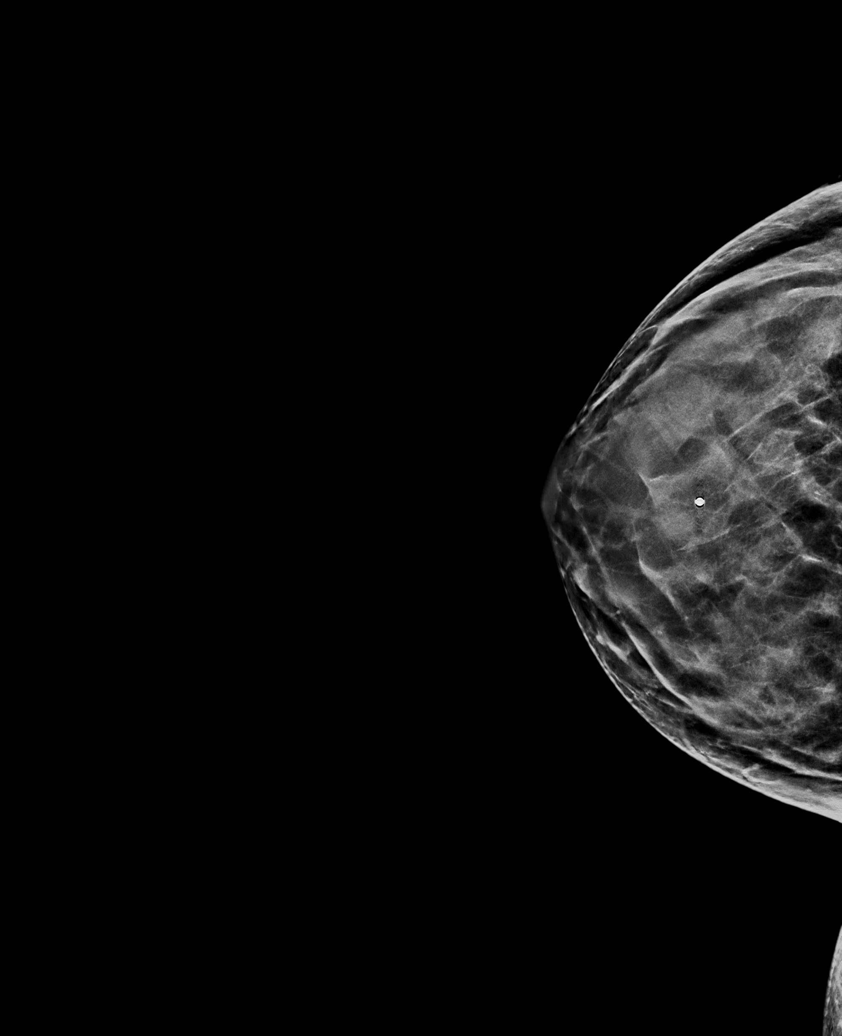

[R TAN synth-2D]
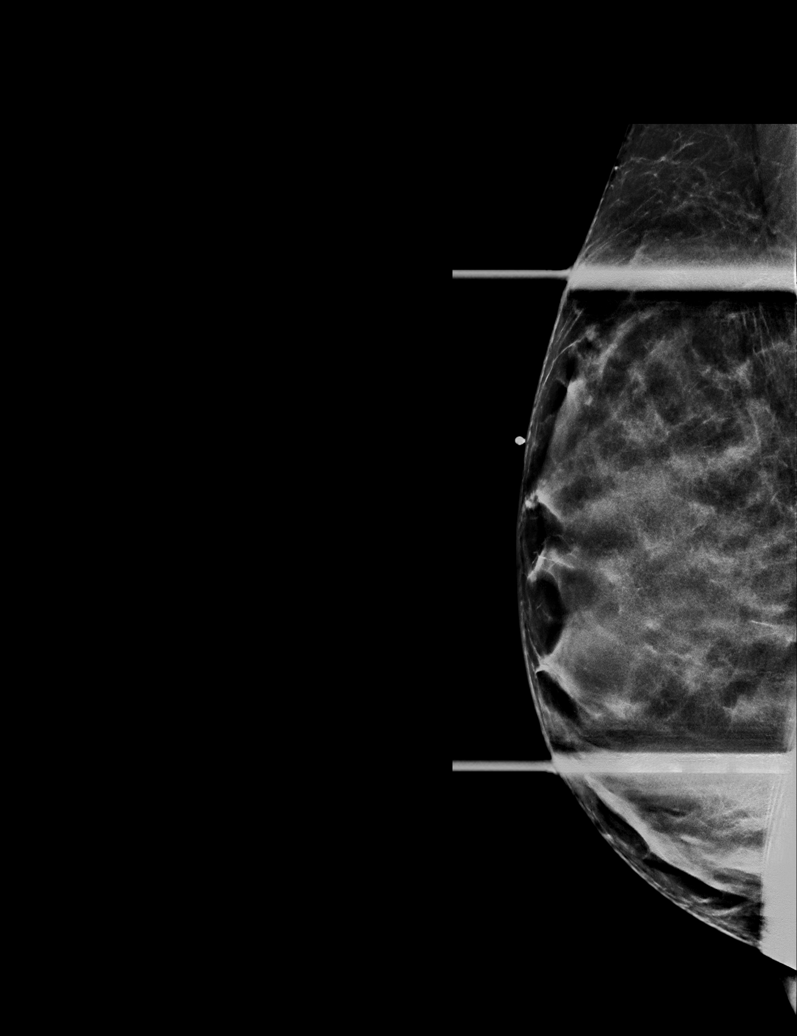

[R MLO tomo · tomo slice 30/59.0]
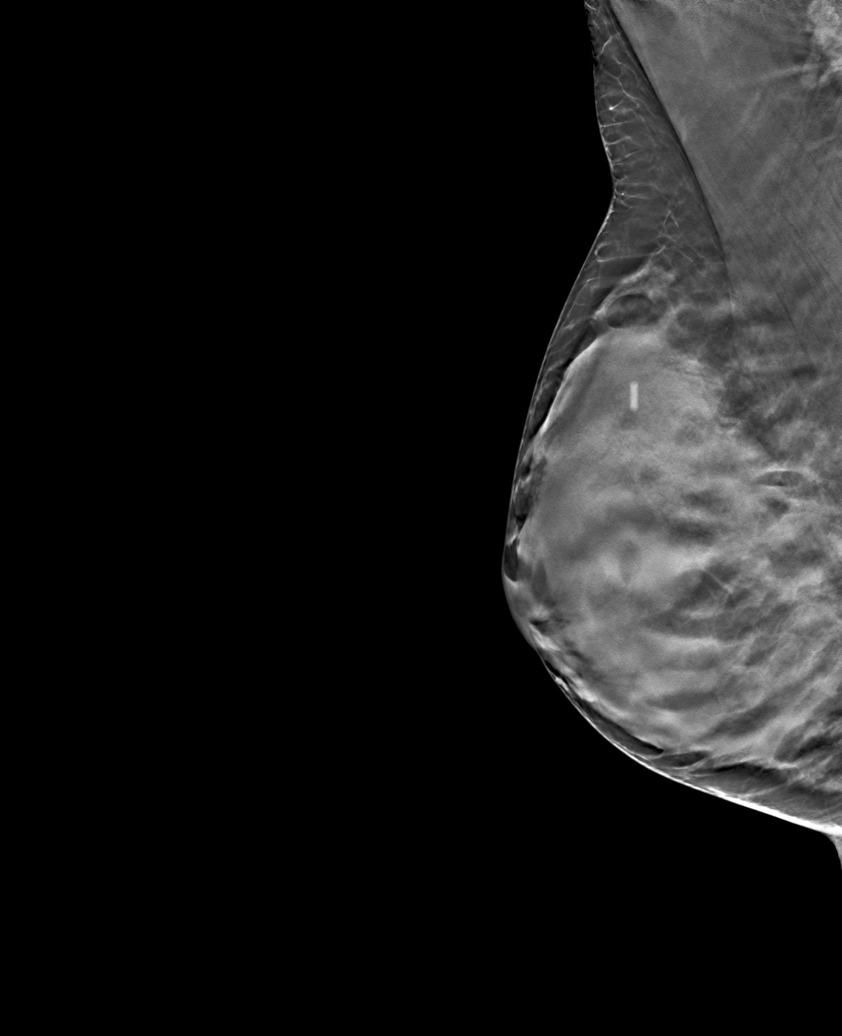

[6 of 30 positions shown; findings below may reference images not displayed]

ACR Breast Density Category d: The breast tissue is extremely dense,
which lowers the sensitivity of mammography.
FINDINGS: Mammographically, there are no suspicious masses, areas of
architectural distortion or microcalcifications in either breast.

Mammographic images were processed with CAD.

On physical exam, no suspicious masses are palpated.

Targeted ultrasound is performed, showing no suspicious masses or
shadowing lesion in the area of palpable concern right upper central
breast.
IMPRESSION: No mammographic sonographic evidence of malignancy in either breast.

RECOMMENDATION:
Further management of patient's area of palpable concern should be
based on clinical grounds.

Otherwise, screening mammogram at age 40 unless there are persistent
or intervening clinical concerns. (Code:CE-I-F5D)

I have discussed the findings and recommendations with the patient.
Results were also provided in writing at the conclusion of the
visit. If applicable, a reminder letter will be sent to the patient
regarding the next appointment.

BI-RADS CATEGORY  1: Negative.

## 2020-07-25 ENCOUNTER — Other Ambulatory Visit: Payer: Self-pay | Admitting: Primary Care

## 2020-07-25 DIAGNOSIS — E039 Hypothyroidism, unspecified: Secondary | ICD-10-CM

## 2020-07-25 NOTE — Telephone Encounter (Signed)
Pt called in due to she completely out of her synthroid and wanted to know if she can get enough until her appointment on 5/24 @10 .20

## 2020-08-15 ENCOUNTER — Ambulatory Visit: Payer: BC Managed Care – PPO | Admitting: Primary Care

## 2020-08-15 ENCOUNTER — Other Ambulatory Visit: Payer: Self-pay

## 2020-08-15 ENCOUNTER — Encounter: Payer: Self-pay | Admitting: Primary Care

## 2020-08-15 VITALS — BP 108/72 | HR 71 | Temp 98.6°F | Ht 64.0 in | Wt 150.0 lb

## 2020-08-15 DIAGNOSIS — R21 Rash and other nonspecific skin eruption: Secondary | ICD-10-CM | POA: Insufficient documentation

## 2020-08-15 DIAGNOSIS — Z0000 Encounter for general adult medical examination without abnormal findings: Secondary | ICD-10-CM | POA: Diagnosis not present

## 2020-08-15 DIAGNOSIS — E039 Hypothyroidism, unspecified: Secondary | ICD-10-CM | POA: Diagnosis not present

## 2020-08-15 DIAGNOSIS — Z1159 Encounter for screening for other viral diseases: Secondary | ICD-10-CM

## 2020-08-15 DIAGNOSIS — Z0001 Encounter for general adult medical examination with abnormal findings: Secondary | ICD-10-CM | POA: Insufficient documentation

## 2020-08-15 DIAGNOSIS — F410 Panic disorder [episodic paroxysmal anxiety] without agoraphobia: Secondary | ICD-10-CM | POA: Diagnosis not present

## 2020-08-15 DIAGNOSIS — R102 Pelvic and perineal pain: Secondary | ICD-10-CM | POA: Diagnosis not present

## 2020-08-15 DIAGNOSIS — R232 Flushing: Secondary | ICD-10-CM

## 2020-08-15 DIAGNOSIS — G8929 Other chronic pain: Secondary | ICD-10-CM | POA: Insufficient documentation

## 2020-08-15 LAB — COMPREHENSIVE METABOLIC PANEL
ALT: 19 U/L (ref 0–35)
AST: 16 U/L (ref 0–37)
Albumin: 4.5 g/dL (ref 3.5–5.2)
Alkaline Phosphatase: 64 U/L (ref 39–117)
BUN: 14 mg/dL (ref 6–23)
CO2: 26 mEq/L (ref 19–32)
Calcium: 9.6 mg/dL (ref 8.4–10.5)
Chloride: 104 mEq/L (ref 96–112)
Creatinine, Ser: 0.76 mg/dL (ref 0.40–1.20)
GFR: 103.09 mL/min (ref >60.00)
Glucose, Bld: 77 mg/dL (ref 70–99)
Potassium: 4.2 mEq/L (ref 3.5–5.1)
Sodium: 139 mEq/L (ref 135–145)
Total Bilirubin: 0.5 mg/dL (ref 0.2–1.2)
Total Protein: 7.4 g/dL (ref 6.0–8.3)

## 2020-08-15 LAB — LIPID PANEL
Cholesterol: 244 mg/dL — ABNORMAL HIGH (ref 0–200)
HDL: 63.9 mg/dL (ref >39.00)
LDL Cholesterol: 164 mg/dL — ABNORMAL HIGH (ref 0–99)
NonHDL: 180.43
Total CHOL/HDL Ratio: 4
Triglycerides: 83 mg/dL (ref 0.0–149.0)
VLDL: 16.6 mg/dL (ref 0.0–40.0)

## 2020-08-15 LAB — TSH: TSH: 1.46 u[IU]/mL (ref 0.35–4.50)

## 2020-08-15 LAB — CBC
HCT: 42.9 % (ref 36.0–46.0)
Hemoglobin: 15.2 g/dL — ABNORMAL HIGH (ref 12.0–15.0)
MCHC: 35.5 g/dL (ref 30.0–36.0)
MCV: 91.7 fl (ref 78.0–100.0)
Platelets: 243 10*3/uL (ref 150.0–400.0)
RBC: 4.67 Mil/uL (ref 3.87–5.11)
RDW: 12.3 % (ref 11.5–15.5)
WBC: 4.1 10*3/uL (ref 4.0–10.5)

## 2020-08-15 MED ORDER — SPRINTEC 28 0.25-35 MG-MCG PO TABS: 1.0000 | ORAL_TABLET | Freq: Every day | ORAL | 3 refills | Status: DC

## 2020-08-15 NOTE — Addendum Note (Signed)
Addended by: Cloyd Stagers on: 08/15/2020 11:17 AM   Modules accepted: Orders

## 2020-08-15 NOTE — Progress Notes (Signed)
Subjective:    Patient ID: Amy Schneider, female    DOB: 1986-02-09, 35 y.o.   MRN: [id number]  HPI  Amy Schneider is a very pleasant 35 y.o. female with a history of hypothyroidism, panic attacks who presents today for complete physical. She is needing a refill of her oral contraceptives.  She would also like to discuss rashes for which are chronic and intermittent. She thinks she may have a food allergy as she's noticed after eating certain foods and sometimes without eating anything. Rashes are red, occur around her oral cavity and nose, around her umbilicus, anterior chest. She denies any other changes.   She is doing well on Sprintec OCP's for chronic pelvic pain since her hysterectomy in August 2020, no longer following with GYN. Is needing refills.   Immunizations: -Tetanus: Declines  -Influenza: Did not complete last season  -Covid-19: Completed 3 vaccines   Diet: She endorses a fair diet.  Exercise: She is exercising twice weekly   Eye exam: No recent exam  Dental exam: Completes semi-annually   Pap Smear: 2020, negative   BP Readings from Last 3 Encounters:  08/15/20 108/72  01/25/20 108/62  07/01/19 106/72       Review of Systems  Constitutional: Negative for unexpected weight change.  HENT: Negative for rhinorrhea.   Respiratory: Negative for cough and shortness of breath.   Cardiovascular: Negative for chest pain.  Gastrointestinal: Negative for constipation and diarrhea.  Genitourinary: Negative for difficulty urinating.  Musculoskeletal: Negative for arthralgias.  Skin: Positive for rash.  Allergic/Immunologic: Negative for environmental allergies.  Neurological: Positive for headaches. Negative for dizziness.       Weather related migraines   Psychiatric/Behavioral: The patient is not nervous/anxious.          Past Medical History:  Diagnosis Date  . Bilateral ovarian cysts   . Bulky and enlarged uterus 08/14/2018   08/12/2018 CT  ABDOMEN AND PELVIS WITH CONTRAST The uterus is enlarged with multiple relatively homogeneously enhancing masses, largest mass measures 7.6 x 6.3 x 8.2 cm. These are probably uterine fibroids. The uterus measures 11.3 x 12.7 x 11.5 cm. The enlarged uterus causes mass effect on the bladder anteriorly and mass effect on the rectal sigmoid colon posteriorly.   Result Notes recorded by Any  . GAD (generalized anxiety disorder)   . Hypothyroidism   . Panic attacks   . Uterine masses 08/14/2018    Social History   Socioeconomic History  . Marital status: Married    Spouse name: Not on file  . Number of children: Not on file  . Years of education: Not on file  . Highest education level: Not on file  Occupational History  . Not on file  Tobacco Use  . Smoking status: Never Smoker  . Smokeless tobacco: Never Used  Substance and Sexual Activity  . Alcohol use: Not Currently  . Drug use: Not on file  . Sexual activity: Not on file  Other Topics Concern  . Not on file  Social History Narrative   Married.   Works as a Theatre manager.   Scientist, physiological Strain: Not on [employer] Insecurity: Not on file  Transportation Needs: Not on file  Physical Activity: Not on file  Stress: Not on file  Social Connections: Not on file  Intimate Partner Violence: Not on file    Past Surgical History:  Procedure Laterality Date  . IR RADIOLOGIST EVAL & MGMT  08/20/2018  .  Kybella  01/2020  . PARTIAL HYSTERECTOMY  11/02/2018    Family History  Problem Relation Age of Onset  . Alcohol abuse Mother   . Depression Mother   . Early death Mother   . Depression Sister   . Diabetes Maternal Grandmother   . Heart attack Maternal Grandmother   . Heart disease Maternal Grandmother   . Stroke Maternal Grandmother   . Parkinson's disease Maternal Grandfather   . Breast cancer Paternal Grandmother     Allergies  Allergen Reactions  . Hydrocodone-Acetaminophen Nausea  And Vomiting    Current Outpatient Medications on File Prior to Visit  Medication Sig Dispense Refill  . propranolol (INDERAL) 80 MG tablet TAKE 1 TABLET BY MOUTH EVERY DAY AS NEEDED FOR ANXIETY 30 tablet 0  . SYNTHROID 25 MCG tablet TAKE 1 TABLET BY MOUTH EVERY MORNING ON AN EMPTY STOMACH WITH WATER ONLY. NO FOOD OR OTHER MEDICATIONS FOR 30 MINUTES. 90 tablet 0  . norgestimate-ethinyl estradiol (ORTHO-CYCLEN) 0.25-35 MG-MCG tablet Take by mouth.     No current facility-administered medications on file prior to visit.    BP 108/72   Pulse 71   Temp 98.6 F (37 C) (Temporal)   Ht 5\' 4"  (1.626 m)   Wt 150 lb (68 kg)   SpO2 98%   BMI 25.75 kg/m  Objective:   Physical Exam HENT:     Right Ear: Tympanic membrane and ear canal normal.     Left Ear: Tympanic membrane and ear canal normal.     Nose: Nose normal.  Eyes:     Conjunctiva/sclera: Conjunctivae normal.     Pupils: Pupils are equal, round, and reactive to light.  Neck:     Thyroid: No thyromegaly.  Cardiovascular:     Rate and Rhythm: Normal rate and regular rhythm.     Heart sounds: No murmur heard.   Pulmonary:     Effort: Pulmonary effort is normal.     Breath sounds: Normal breath sounds. No rales.  Abdominal:     General: Bowel sounds are normal.     Palpations: Abdomen is soft.     Tenderness: There is no abdominal tenderness.  Musculoskeletal:        General: Normal range of motion.     Cervical back: Neck supple.  Lymphadenopathy:     Cervical: No cervical adenopathy.  Skin:    General: Skin is warm and dry.     Findings: No rash.  Neurological:     Mental Status: She is alert and oriented to person, place, and time.     Cranial Nerves: No cranial nerve deficit.     Deep Tendon Reflexes: Reflexes are normal and symmetric.  Psychiatric:        Mood and Affect: Mood normal.           Assessment & Plan:      This visit occurred during the SARS-CoV-2 public health emergency.  Safety  protocols were in place, including screening questions prior to the visit, additional usage of staff PPE, and extensive cleaning of exam room while observing appropriate contact time as indicated for disinfecting solutions.

## 2020-08-15 NOTE — Assessment & Plan Note (Addendum)
Unclear cause, will check food allergy profile.  Consider further work up and/or allergy referral if negative.

## 2020-08-15 NOTE — Assessment & Plan Note (Signed)
Overall stable as long as she avoids triggers.

## 2020-08-15 NOTE — Assessment & Plan Note (Signed)
Doing well on OCP's since partial hysterectomy in 2020. Refills provided. She takes continuously.

## 2020-08-15 NOTE — Patient Instructions (Signed)
Stop by the lab prior to leaving today. I will notify you of your results once received.   It was a pleasure to see you today!   Preventive Care 33-33 Years Old, Female Preventive care refers to lifestyle choices and visits with your health care provider that can promote health and wellness. This includes:  A yearly physical exam. This is also called an annual wellness visit.  Regular dental and eye exams.  Immunizations.  Screening for certain conditions.  Healthy lifestyle choices, such as: ? Eating a healthy diet. ? Getting regular exercise. ? Not using drugs or products that contain nicotine and tobacco. ? Limiting alcohol use. What can I expect for my preventive care visit? Physical exam Your health care provider may check your:  Height and weight. These may be used to calculate your BMI (body mass index). BMI is a measurement that tells if you are at a healthy weight.  Heart rate and blood pressure.  Body temperature.  Skin for abnormal spots. Counseling Your health care provider may ask you questions about your:  Past medical problems.  Family's medical history.  Alcohol, tobacco, and drug use.  Emotional well-being.  Home life and relationship well-being.  Sexual activity.  Diet, exercise, and sleep habits.  Work and work Statistician.  Access to firearms.  Method of birth control.  Menstrual cycle.  Pregnancy history. What immunizations do I need? Vaccines are usually given at various ages, according to a schedule. Your health care provider will recommend vaccines for you based on your age, medical history, and lifestyle or other factors, such as travel or where you work.   What tests do I need? Blood tests  Lipid and cholesterol levels. These may be checked every 5 years starting at age 13.  Hepatitis C test.  Hepatitis B test. Screening  Diabetes screening. This is done by checking your blood sugar (glucose) after you have not eaten for  a while (fasting).  STD (sexually transmitted disease) testing, if you are at risk.  BRCA-related cancer screening. This may be done if you have a family history of breast, ovarian, tubal, or peritoneal cancers.  Pelvic exam and Pap test. This may be done every 3 years starting at age 33. Starting at age 71, this may be done every 5 years if you have a Pap test in combination with an HPV test. Talk with your health care provider about your test results, treatment options, and if necessary, the need for more tests.   Follow these instructions at home: Eating and drinking  Eat a healthy diet that includes fresh fruits and vegetables, whole grains, lean protein, and low-fat dairy products.  Take vitamin and mineral supplements as recommended by your health care provider.  Do not drink alcohol if: ? Your health care provider tells you not to drink. ? You are pregnant, may be pregnant, or are planning to become pregnant.  If you drink alcohol: ? Limit how much you have to 0-1 drink a day. ? Be aware of how much alcohol is in your drink. In the [place] one drink equals one 12 oz bottle of beer (355 mL), one 5 oz glass of wine (148 mL), or one 1 oz glass of hard liquor (44 mL).   Lifestyle  Take daily care of your teeth and gums. Brush your teeth every morning and night with fluoride toothpaste. Floss one time each day.  Stay active. Exercise for at least 30 minutes 5 or more days each week.  Do  not use any products that contain nicotine or tobacco, such as cigarettes, e-cigarettes, and chewing tobacco. If you need help quitting, ask your health care provider.  Do not use drugs.  If you are sexually active, practice safe sex. Use a condom or other form of protection to prevent STIs (sexually transmitted infections).  If you do not wish to become pregnant, use a form of birth control. If you plan to become pregnant, see your health care provider for a prepregnancy visit.  Find healthy ways  to cope with stress, such as: ? Meditation, yoga, or listening to music. ? Journaling. ? Talking to a trusted person. ? Spending time with friends and family. Safety  Always wear your seat belt while driving or riding in a vehicle.  Do not drive: ? If you have been drinking alcohol. Do not ride with someone who has been drinking. ? When you are tired or distracted. ? While texting.  Wear a helmet and other protective equipment during sports activities.  If you have firearms in your house, make sure you follow all gun safety procedures.  Seek help if you have been physically or sexually abused. What's next?  Go to your health care provider once a year for an annual wellness visit.  Ask your health care provider how often you should have your eyes and teeth checked.  Stay up to date on all vaccines. This information is not intended to replace advice given to you by your health care provider. Make sure you discuss any questions you have with your health care provider. Document Revised: 11/07/2019 Document Reviewed: 11/20/2017 Elsevier Patient Education  2021 [employer].

## 2020-08-15 NOTE — Assessment & Plan Note (Signed)
She is taking Synthroid correctly, continue Synthroid 25 mcg. Repeat TSH pending.

## 2020-08-15 NOTE — Assessment & Plan Note (Signed)
Doing well on propranolol 80 mg PRN, continue same.

## 2020-08-16 LAB — FOOD ALLERGY PROFILE
Allergen, Salmon, f41: <0.1 kU/L
Almonds: <0.1 kU/L
CLASS: 0
CLASS: 0
CLASS: 0
CLASS: 0
CLASS: 0
CLASS: 0
CLASS: 0
CLASS: 0
CLASS: 0
CLASS: 0
CLASS: 0
Cashew IgE: <0.1 kU/L
Class: 0
Class: 0
Class: 0
Class: 0
Egg White IgE: <0.1 kU/L
Fish Cod: <0.1 kU/L
Hazelnut: <0.1 kU/L
Milk IgE: <0.1 kU/L
Peanut IgE: <0.1 kU/L
Scallop IgE: <0.1 kU/L
Sesame Seed f10: <0.1 kU/L
Shrimp IgE: <0.1 kU/L
Soybean IgE: <0.1 kU/L
Tuna IgE: <0.1 kU/L
Walnut: <0.1 kU/L
Wheat IgE: <0.1 kU/L

## 2020-08-16 LAB — INTERPRETATION:

## 2020-08-16 LAB — HEPATITIS C ANTIBODY
Hepatitis C Ab: NONREACTIVE
SIGNAL TO CUT-OFF: 0.01 (ref <1.00)

## 2020-08-16 LAB — GREEN PEPPER (UNRIPE) (F263) IGE
Class: 0
GREEN PEPPER (UNRIPE) (F263) IGE: <0.1 kU/L

## 2020-08-17 DIAGNOSIS — R21 Rash and other nonspecific skin eruption: Secondary | ICD-10-CM

## 2020-08-17 DIAGNOSIS — E785 Hyperlipidemia, unspecified: Secondary | ICD-10-CM

## 2020-10-17 ENCOUNTER — Other Ambulatory Visit: Payer: Self-pay | Admitting: Family Medicine

## 2020-10-17 DIAGNOSIS — E039 Hypothyroidism, unspecified: Secondary | ICD-10-CM

## 2020-10-17 DIAGNOSIS — R21 Rash and other nonspecific skin eruption: Secondary | ICD-10-CM

## 2020-10-18 NOTE — Telephone Encounter (Signed)
Ok to make change in referral

## 2020-12-05 DIAGNOSIS — T781XXD Other adverse food reactions, not elsewhere classified, subsequent encounter: Secondary | ICD-10-CM | POA: Diagnosis not present

## 2020-12-05 DIAGNOSIS — L503 Dermatographic urticaria: Secondary | ICD-10-CM | POA: Diagnosis not present

## 2020-12-05 DIAGNOSIS — L509 Urticaria, unspecified: Secondary | ICD-10-CM | POA: Diagnosis not present

## 2020-12-05 DIAGNOSIS — J3089 Other allergic rhinitis: Secondary | ICD-10-CM | POA: Diagnosis not present

## 2020-12-05 DIAGNOSIS — Q828 Other specified congenital malformations of skin: Secondary | ICD-10-CM | POA: Diagnosis not present

## 2020-12-19 DIAGNOSIS — L509 Urticaria, unspecified: Secondary | ICD-10-CM | POA: Diagnosis not present

## 2020-12-19 DIAGNOSIS — Q828 Other specified congenital malformations of skin: Secondary | ICD-10-CM | POA: Diagnosis not present

## 2020-12-19 DIAGNOSIS — J3089 Other allergic rhinitis: Secondary | ICD-10-CM | POA: Diagnosis not present

## 2020-12-19 DIAGNOSIS — T781XXD Other adverse food reactions, not elsewhere classified, subsequent encounter: Secondary | ICD-10-CM | POA: Diagnosis not present

## 2021-07-08 ENCOUNTER — Other Ambulatory Visit: Payer: Self-pay | Admitting: Primary Care

## 2021-07-08 DIAGNOSIS — E039 Hypothyroidism, unspecified: Secondary | ICD-10-CM

## 2021-07-08 NOTE — Telephone Encounter (Signed)
Patient due for CPE in June. She will need this for continued refills of her medication. Please schedule.  ?

## 2021-07-12 NOTE — Telephone Encounter (Signed)
Left message to return call to our office.  

## 2021-07-19 NOTE — Telephone Encounter (Signed)
Left message to return call to our office.  ?And My chart message sent to call office.  ?

## 2021-07-20 NOTE — Telephone Encounter (Signed)
Left message on voicemail to return my call.  

## 2021-07-24 NOTE — Telephone Encounter (Signed)
We have called patient x 3 and sent my chart message as well no call back from patient.  ?

## 2021-08-14 ENCOUNTER — Telehealth: Payer: Self-pay

## 2021-08-14 NOTE — Telephone Encounter (Signed)
I spoke with pt and she said she last saw blood in BM about 1 month ago; pt went to UC because she was moving and was told might have internal hemorrhoid but to FU with PCP. Pt did not FU with Anda Kraft since she was moving and pt scheduled my chart appt 08/28/21 because she wanted to have blood test done and ck up along with cking bowel issue.pt said no abd pain and now pt having normal BMs. Offered pt a sooner appt to ck bowel but pt wants done at same time as CPX. Cindy at front desk will schedule CPX for pt and if pt still decides to wait for that appt to get bowel cked at that time pt will go to [facility] or ED if needed. UC & ED precautions given and pt voiced understanding. Sending note to Gentry Fitz NP and [facility] CMA. Cindy at front desk scheduled CPX on 09/26/21 with Anda Kraft and pt told Jenny Reichmann to cancel the 08/28/21 appt.

## 2021-08-14 NOTE — Telephone Encounter (Signed)
From: Aniceto Boss  Sent: 08/14/2021  11:44 AM EDT  To: [facility] Triage  Subject: FW: Appointment scheduled from [place]             ----- Message -----  From: Dahlia Bailiff  Sent: 08/14/2021   9:13 AM EDT  To: [facility] Support Pool  Subject: Appointment scheduled from [place]               Appointment For: Leavy Cella Shrout ([id number])  Visit Type: OFFICE VISIT (1004)    08/28/2021    11:40 AM  20 mins.  Pleas Koch, NP    [facility]    Patient Comments:  Need to have thyroid checked and cholesterol. Have some questions about my stool, I had blood in it twice and it's oily/floats a lot.       Appointment Scheduled (Newest Message First) You routed conversation to You Just now (1:16 PM)   Aniceto Boss routed conversation to [facility] Triage 1 hour ago (11:44 AM)   Mychart, Generic  Leavy Cella Parish 4 hours ago (9:13 AM)   GM Appointment Information:     Visit Type: Office Visit         Date: 08/28/2021                 Dept: [facility]                 Provider: Pleas Koch                 Time: 11:40 AM                 Length: 20 min   Appt Status: Scheduled     Appt Instructions:    Please arrive 15 minutes prior to your appointment. This will allow [place] to  verify and update your medical record and ensure a full appointment for you  within the time allotted.    This MyChart message has not been read.   [place]  Patient Appointment Schedule Request Pool 4 hours ago (9:13 AM)   [facility] Appointment For: Leavy Cella Koffman ([id number]) Visit Type: OFFICE VISIT (1004)   08/28/2021    11:40 AM  20 mins.  Pleas Koch, NP    [facility]   Patient Comments: Need to have thyroid checked and cholesterol. Have some questions about my stool, I had blood in it twice and it's oily/floats a lot.

## 2021-08-15 NOTE — Telephone Encounter (Signed)
Noted, will evaluate patient as scheduled. Agree with triage recommendations

## 2021-08-28 ENCOUNTER — Ambulatory Visit: Payer: BC Managed Care – PPO | Admitting: Primary Care

## 2021-09-26 ENCOUNTER — Ambulatory Visit (INDEPENDENT_AMBULATORY_CARE_PROVIDER_SITE_OTHER): Payer: BC Managed Care – PPO | Admitting: Primary Care

## 2021-09-26 ENCOUNTER — Encounter: Payer: Self-pay | Admitting: Primary Care

## 2021-09-26 ENCOUNTER — Other Ambulatory Visit (HOSPITAL_COMMUNITY)
Admission: RE | Admit: 2021-09-26 | Discharge: 2021-09-26 | Disposition: A | Discharge status: Home / Self Care | Payer: BC Managed Care – PPO | Source: Ambulatory Visit | Attending: Primary Care | Admitting: Primary Care

## 2021-09-26 VITALS — BP 104/68 | HR 73 | Temp 98.6°F | Ht 64.0 in | Wt 144.0 lb

## 2021-09-26 DIAGNOSIS — E785 Hyperlipidemia, unspecified: Secondary | ICD-10-CM | POA: Insufficient documentation

## 2021-09-26 DIAGNOSIS — K625 Hemorrhage of anus and rectum: Secondary | ICD-10-CM | POA: Diagnosis not present

## 2021-09-26 DIAGNOSIS — Z0001 Encounter for general adult medical examination with abnormal findings: Secondary | ICD-10-CM

## 2021-09-26 DIAGNOSIS — R002 Palpitations: Secondary | ICD-10-CM

## 2021-09-26 DIAGNOSIS — E039 Hypothyroidism, unspecified: Secondary | ICD-10-CM

## 2021-09-26 DIAGNOSIS — F410 Panic disorder [episodic paroxysmal anxiety] without agoraphobia: Secondary | ICD-10-CM

## 2021-09-26 DIAGNOSIS — R300 Dysuria: Secondary | ICD-10-CM | POA: Insufficient documentation

## 2021-09-26 DIAGNOSIS — Z124 Encounter for screening for malignant neoplasm of cervix: Secondary | ICD-10-CM | POA: Insufficient documentation

## 2021-09-26 LAB — POC URINALSYSI DIPSTICK (AUTOMATED)
Bilirubin, UA: NEGATIVE
Blood, UA: NEGATIVE
Glucose, UA: NEGATIVE
Ketones, UA: NEGATIVE
Leukocytes, UA: NEGATIVE
Nitrite, UA: NEGATIVE
Protein, UA: NEGATIVE
Spec Grav, UA: 1.015 (ref 1.010–1.025)
Urobilinogen, UA: 0.2 E.U./dL
pH, UA: 6 (ref 5.0–8.0)

## 2021-09-26 LAB — HEMOCCULT GUIAC POC 1CARD (OFFICE)
Card #1 Date: 7052023
Fecal Occult Blood, POC: NEGATIVE

## 2021-09-26 NOTE — Assessment & Plan Note (Signed)
Tetanus due, she declines today. Pap smear due, performed today.  Commended her on regular exercise and an overall healthy diet.  Exam today as noted. Labs pending.

## 2021-09-26 NOTE — Assessment & Plan Note (Signed)
UA today negative.  Wet prep pending.  Encouraged to increase water intake on a daily basis.

## 2021-09-26 NOTE — Addendum Note (Signed)
Addended by: Francella Solian on: 09/26/2021 03:06 PM   Modules accepted: Orders

## 2021-09-26 NOTE — Assessment & Plan Note (Addendum)
Labs pending today.  ECG today with normal sinus rhythm, rate of 63. No PAC/PVC, no acute ST changes. Appears similar to ECG from 2019.  Consider cardiology evaluation.

## 2021-09-26 NOTE — Assessment & Plan Note (Signed)
She is taking Synthroid correctly.  Continue Synthroid 25 mcg daily. Repeat TSH pending.

## 2021-09-26 NOTE — Progress Notes (Signed)
Subjective:    Patient ID: Amy Schneider, female    DOB: 06/14/1985, 36 y.o.   MRN: [id number]  Dysuria  Associated symptoms include frequency, hematuria and urgency. Pertinent negatives include no flank pain.    Amy Schneider is a very pleasant 36 y.o. female who presents today for complete physical and follow up of chronic conditions and to discuss multiple issues.  She would also like to discuss dysuria and bloody stools.  1) Dysuria: Symptoms include urinary urgency, bilateral lower back pain, left lower abdominal cramping, hematuria, but has been spotting from menses. Also with mild vaginal itching and discharge. She denies nausea, vomiting, fevers, flank pain.  She has been taking AZO OTC, last dose was last night. Symptoms began three days ago. Prior to symptom onset she was in the garage, a lot of sweating, may have been dehydrated.   2) Rectal Bleeding: Acute episode occurring in April 2023, bright red rectal bleeding that occurred with a bowel movement. Also with rectal pain and left lower abdominal cramping. Had [place] that night. She noticed the blood on the toilet paper and in the toilet bowl.   She's also concerned about her stools as she notices oily stools that float, this began about 7 years ago. She's noticed increased oily consistency. She has increased water consumption.  Evaluated by Urgent Care at the time, was told that she may have a hemorrhoid. Her bleeding has not occurred since.   She remembers experiencing one prior episode of rectal bleeding about 1 year ago, occurred with a bowel movement. She has a family history of colon cancer on her grandmother's side.   3) Palpitations: Chronic and intermittent for years, occurs when resting or with anxiety symptoms. Symptoms will occur 6 times annually, lasting <30 seconds. Also with shortness of breath. She denies palpitations with exercise. Over the last three years she's noticed symptoms occurring  more frequently.   Immunizations: -Tetanus: Due, she declines today.  -Influenza: Did not complete last season -Covid-19: 3 vaccines  Diet: Healthy diet.  Exercise: Exercising regularly   Eye exam: Completed several years ago.  Dental exam: Completes semi-annually   Pap Smear: Completed in July 2020, due today.   BP Readings from Last 3 Encounters:  09/26/21 104/68  08/15/20 108/72  01/25/20 108/62    Wt Readings from Last 3 Encounters:  09/26/21 144 lb (65.3 kg)  08/15/20 150 lb (68 kg)  01/25/20 150 lb (68 kg)      Review of Systems  Constitutional:  Negative for unexpected weight change.  HENT:  Negative for rhinorrhea.   Respiratory:  Negative for cough and shortness of breath.   Cardiovascular:  Negative for chest pain.  Gastrointestinal:  Positive for abdominal pain and blood in stool. Negative for constipation and diarrhea.  Genitourinary:  Positive for dysuria, frequency, hematuria and urgency. Negative for difficulty urinating, flank pain, menstrual problem and vaginal discharge.  Musculoskeletal:  Positive for back pain.  Skin:  Negative for rash.  Allergic/Immunologic: Negative for environmental allergies.  Neurological:  Negative for dizziness and headaches.  Psychiatric/Behavioral:  The patient is not nervous/anxious.          Past Medical History:  Diagnosis Date   Bilateral ovarian cysts    Bulky and enlarged uterus 08/14/2018   08/12/2018 CT ABDOMEN AND PELVIS WITH CONTRAST The uterus is enlarged with multiple relatively homogeneously enhancing masses, largest mass measures 7.6 x 6.3 x 8.2 cm. These are probably uterine fibroids. The uterus measures 11.3  x 12.7 x 11.5 cm. The enlarged uterus causes mass effect on the bladder anteriorly and mass effect on the rectal sigmoid colon posteriorly.   Result Notes recorded by Any   GAD (generalized anxiety disorder)    Hypothyroidism    Panic attacks    Uterine masses 08/14/2018    Social History    Socioeconomic History   Marital status: Married    Spouse name: Not on file   Number of children: Not on file   Years of education: Not on file   Highest education level: Not on file  Occupational History   Not on file  Tobacco Use   Smoking status: Never   Smokeless tobacco: Never  Substance and Sexual Activity   Alcohol use: Not Currently   Drug use: Not on file   Sexual activity: Not on file  Other Topics Concern   Not on file  Social History Narrative   Married.   Works as a Theatre manager.   Scientist, physiological Strain: Not on [employer] Insecurity: Not on file  Transportation Needs: Not on file  Physical Activity: Not on file  Stress: Not on file  Social Connections: Not on file  Intimate Partner Violence: Not on file    Past Surgical History:  Procedure Laterality Date   IR RADIOLOGIST EVAL & MGMT  08/20/2018   Kybella  01/2020   PARTIAL HYSTERECTOMY  11/02/2018    Family History  Problem Relation Age of Onset   Alcohol abuse Mother    Depression Mother    Early death Mother    Depression Sister    Diabetes Maternal Grandmother    Heart attack Maternal Grandmother    Heart disease Maternal Grandmother    Stroke Maternal Grandmother    Parkinson's disease Maternal Grandfather    Breast cancer Paternal Grandmother     Allergies  Allergen Reactions   Hydrocodone-Acetaminophen Nausea And Vomiting    Current Outpatient Medications on File Prior to Visit  Medication Sig Dispense Refill   propranolol (INDERAL) 80 MG tablet TAKE 1 TABLET BY MOUTH EVERY DAY AS NEEDED FOR ANXIETY 30 tablet 0   SYNTHROID 25 MCG tablet TAKE 1 TABLET EVERY MORNING ON EMPTY STOMACH WITH WATER ONLY. NO FOOD OR OTHER MEDS FOR 30 MINUTES. Office visit required in June for further refills. 90 tablet 0   No current facility-administered medications on file prior to visit.    BP 104/68   Pulse 73   Temp 98.6 F (37 C) (Oral)   Ht '5\' 4"'$  (1.626 m)    Wt 144 lb (65.3 kg)   SpO2 98%   BMI 24.72 kg/m  Objective:   Physical Exam Exam conducted with a chaperone present.  HENT:     Right Ear: Tympanic membrane and ear canal normal.     Left Ear: Tympanic membrane and ear canal normal.     Nose: Nose normal.  Eyes:     Conjunctiva/sclera: Conjunctivae normal.     Pupils: Pupils are equal, round, and reactive to light.  Neck:     Thyroid: No thyromegaly.  Cardiovascular:     Rate and Rhythm: Normal rate and regular rhythm.     Heart sounds: No murmur heard. Pulmonary:     Effort: Pulmonary effort is normal.     Breath sounds: Normal breath sounds. No rales.  Abdominal:     General: Bowel sounds are normal.     Palpations: Abdomen is soft.  Tenderness: There is no abdominal tenderness.  Genitourinary:    Labia:        Right: No rash, tenderness or lesion.        Left: No rash, tenderness or lesion.      Vagina: Vaginal discharge present.     Cervix: Discharge present.     Rectum: Guaiac result negative. No tenderness or external hemorrhoid.     Comments: Scant amount of dark red discharge from cervix Musculoskeletal:        General: Normal range of motion.     Cervical back: Neck supple.  Lymphadenopathy:     Cervical: No cervical adenopathy.  Skin:    General: Skin is warm and dry.     Findings: No rash.  Neurological:     Mental Status: She is alert and oriented to person, place, and time.     Cranial Nerves: No cranial nerve deficit.     Deep Tendon Reflexes: Reflexes are normal and symmetric.  Psychiatric:        Mood and Affect: Mood normal.           Assessment & Plan:   Problem List Items Addressed This Visit       Digestive   Rectal bleeding    Negative Hemoccult stool card today. CBC pending.  Referral placed to GI per patient request.      Relevant Orders   POCT occult blood stool (Completed)   Ambulatory referral to Gastroenterology     Endocrine   Hypothyroidism    She is taking  Synthroid correctly.  Continue Synthroid 25 mcg daily. Repeat TSH pending.      Relevant Orders   TSH     Other   Palpitations    Labs pending today.  ECG today with normal sinus rhythm, rate of 63. No PAC/PVC, no acute ST changes. Appears similar to ECG from 2019.  Consider cardiology evaluation.      Relevant Orders   CBC   EKG 12-Lead   Panic attacks    Controlled.  Continue propranolol 80 mg PRN.      Encounter for annual general medical examination with abnormal findings in adult - Primary    Tetanus due, she declines today. Pap smear due, performed today.  Commended her on regular exercise and an overall healthy diet.  Exam today as noted. Labs pending.      Dysuria    UA today negative.  Wet prep pending.  Encouraged to increase water intake on a daily basis.      Relevant Orders   POCT Urinalysis Dipstick (Automated) (Completed)   Hyperlipidemia    Repeat lipid panel pending.  Commended her on dietary changes and weight loss.      Relevant Orders   Lipid panel   Comprehensive metabolic panel   Other Visit Diagnoses     Screening for cervical cancer       Relevant Orders   Cytology - PAP          Pleas Koch, NP

## 2021-09-26 NOTE — Assessment & Plan Note (Signed)
Controlled.  Continue propranolol 80 mg PRN.

## 2021-09-26 NOTE — Addendum Note (Signed)
Addended by: Ellamae Sia on: 09/26/2021 03:13 PM   Modules accepted: Orders

## 2021-09-26 NOTE — Assessment & Plan Note (Addendum)
Negative Hemoccult stool card today. CBC pending.  Referral placed to GI per patient request.

## 2021-09-26 NOTE — Assessment & Plan Note (Signed)
Repeat lipid panel pending.  Commended her on dietary changes and weight loss.

## 2021-09-26 NOTE — Patient Instructions (Signed)
Stop by the lab prior to leaving today. I will notify you of your results once received.   You will be contacted regarding your referral to GI.  Please let us know if you have not been contacted within two weeks.   It was a pleasure to see you today!  Preventive Care 2-34 Years Old, Female Preventive care refers to lifestyle choices and visits with your health care provider that can promote health and wellness. Preventive care visits are also called wellness exams. What can I expect for my preventive care visit? Counseling During your preventive care visit, your health care provider may ask about your: Medical history, including: Past medical problems. Family medical history. Pregnancy history. Current health, including: Menstrual cycle. Method of birth control. Emotional well-being. Home life and relationship well-being. Sexual activity and sexual health. Lifestyle, including: Alcohol, nicotine or tobacco, and drug use. Access to firearms. Diet, exercise, and sleep habits. Work and work Statistician. Sunscreen use. Safety issues such as seatbelt and bike helmet use. Physical exam Your health care provider may check your: Height and weight. These may be used to calculate your BMI (body mass index). BMI is a measurement that tells if you are at a healthy weight. Waist circumference. This measures the distance around your waistline. This measurement also tells if you are at a healthy weight and may help predict your risk of certain diseases, such as type 2 diabetes and high blood pressure. Heart rate and blood pressure. Body temperature. Skin for abnormal spots. What immunizations do I need?  Vaccines are usually given at various ages, according to a schedule. Your health care provider will recommend vaccines for you based on your age, medical history, and lifestyle or other factors, such as travel or where you work. What tests do I need? Screening Your health care provider may  recommend screening tests for certain conditions. This may include: Pelvic exam and Pap test. Lipid and cholesterol levels. Diabetes screening. This is done by checking your blood sugar (glucose) after you have not eaten for a while (fasting). Hepatitis B test. Hepatitis C test. HIV (human immunodeficiency virus) test. STI (sexually transmitted infection) testing, if you are at risk. BRCA-related cancer screening. This may be done if you have a family history of breast, ovarian, tubal, or peritoneal cancers. Talk with your health care provider about your test results, treatment options, and if necessary, the need for more tests. Follow these instructions at home: Eating and drinking  Eat a healthy diet that includes fresh fruits and vegetables, whole grains, lean protein, and low-fat dairy products. Take vitamin and mineral supplements as recommended by your health care provider. Do not drink alcohol if: Your health care provider tells you not to drink. You are pregnant, may be pregnant, or are planning to become pregnant. If you drink alcohol: Limit how much you have to 0-1 drink a day. Know how much alcohol is in your drink. In the [place] one drink equals one 12 oz bottle of beer (355 mL), one 5 oz glass of wine (148 mL), or one 1 oz glass of hard liquor (44 mL). Lifestyle Brush your teeth every morning and night with fluoride toothpaste. Floss one time each day. Exercise for at least 30 minutes 5 or more days each week. Do not use any products that contain nicotine or tobacco. These products include cigarettes, chewing tobacco, and vaping devices, such as e-cigarettes. If you need help quitting, ask your health care provider. Do not use drugs. If you are sexually  active, practice safe sex. Use a condom or other form of protection to prevent STIs. If you do not wish to become pregnant, use a form of birth control. If you plan to become pregnant, see your health care provider for a  prepregnancy visit. Find healthy ways to manage stress, such as: Meditation, yoga, or listening to music. Journaling. Talking to a trusted person. Spending time with friends and family. Minimize exposure to UV radiation to reduce your risk of skin cancer. Safety Always wear your seat belt while driving or riding in a vehicle. Do not drive: If you have been drinking alcohol. Do not ride with someone who has been drinking. If you have been using any mind-altering substances or drugs. While texting. When you are tired or distracted. Wear a helmet and other protective equipment during sports activities. If you have firearms in your house, make sure you follow all gun safety procedures. Seek help if you have been physically or sexually abused. What's next? Go to your health care provider once a year for an annual wellness visit. Ask your health care provider how often you should have your eyes and teeth checked. Stay up to date on all vaccines. This information is not intended to replace advice given to you by your health care provider. Make sure you discuss any questions you have with your health care provider. Document Revised: 09/06/2020 Document Reviewed: 09/06/2020 Elsevier Patient Education  [place].

## 2021-09-27 LAB — CBC
HCT: 39.6 % (ref 36.0–46.0)
Hemoglobin: 13.4 g/dL (ref 12.0–15.0)
MCHC: 33.9 g/dL (ref 30.0–36.0)
MCV: 94.7 fl (ref 78.0–100.0)
Platelets: 240 10*3/uL (ref 150.0–400.0)
RBC: 4.19 Mil/uL (ref 3.87–5.11)
RDW: 12.2 % (ref 11.5–15.5)
WBC: 4.6 10*3/uL (ref 4.0–10.5)

## 2021-09-27 LAB — WET PREP BY MOLECULAR PROBE
Candida species: NOT DETECTED
Gardnerella vaginalis: NOT DETECTED
MICRO NUMBER:: 13606672
SPECIMEN QUALITY:: ADEQUATE
Trichomonas vaginosis: NOT DETECTED

## 2021-09-27 LAB — LIPID PANEL
Cholesterol: 184 mg/dL (ref 0–200)
HDL: 58.4 mg/dL (ref >39.00)
LDL Cholesterol: 111 mg/dL — ABNORMAL HIGH (ref 0–99)
NonHDL: 125.96
Total CHOL/HDL Ratio: 3
Triglycerides: 73 mg/dL (ref 0.0–149.0)
VLDL: 14.6 mg/dL (ref 0.0–40.0)

## 2021-09-27 LAB — COMPREHENSIVE METABOLIC PANEL
ALT: 16 U/L (ref 0–35)
AST: 16 U/L (ref 0–37)
Albumin: 4.4 g/dL (ref 3.5–5.2)
Alkaline Phosphatase: 70 U/L (ref 39–117)
BUN: 7 mg/dL (ref 6–23)
CO2: 25 mEq/L (ref 19–32)
Calcium: 9.2 mg/dL (ref 8.4–10.5)
Chloride: 106 mEq/L (ref 96–112)
Creatinine, Ser: 0.72 mg/dL (ref 0.40–1.20)
GFR: 109.14 mL/min (ref >60.00)
Glucose, Bld: 80 mg/dL (ref 70–99)
Potassium: 4.2 mEq/L (ref 3.5–5.1)
Sodium: 138 mEq/L (ref 135–145)
Total Bilirubin: 0.6 mg/dL (ref 0.2–1.2)
Total Protein: 6.7 g/dL (ref 6.0–8.3)

## 2021-09-27 LAB — TSH: TSH: 2.11 u[IU]/mL (ref 0.35–5.50)

## 2021-10-01 LAB — CYTOLOGY - PAP
Comment: NEGATIVE
Diagnosis: NEGATIVE
High risk HPV: NEGATIVE

## 2021-10-11 ENCOUNTER — Other Ambulatory Visit: Payer: Self-pay | Admitting: Primary Care

## 2021-10-11 DIAGNOSIS — E039 Hypothyroidism, unspecified: Secondary | ICD-10-CM

## 2021-12-18 DIAGNOSIS — D225 Melanocytic nevi of trunk: Secondary | ICD-10-CM | POA: Diagnosis not present

## 2021-12-18 DIAGNOSIS — L538 Other specified erythematous conditions: Secondary | ICD-10-CM | POA: Diagnosis not present

## 2021-12-18 DIAGNOSIS — L218 Other seborrheic dermatitis: Secondary | ICD-10-CM | POA: Diagnosis not present

## 2021-12-18 DIAGNOSIS — R208 Other disturbances of skin sensation: Secondary | ICD-10-CM | POA: Diagnosis not present

## 2022-10-01 ENCOUNTER — Encounter: Payer: Self-pay | Admitting: Primary Care

## 2022-10-01 ENCOUNTER — Ambulatory Visit (INDEPENDENT_AMBULATORY_CARE_PROVIDER_SITE_OTHER): Payer: BC Managed Care – PPO | Admitting: Primary Care

## 2022-10-01 VITALS — BP 116/80 | HR 97 | Temp 97.7°F | Ht 64.0 in | Wt 145.0 lb

## 2022-10-01 DIAGNOSIS — E039 Hypothyroidism, unspecified: Secondary | ICD-10-CM

## 2022-10-01 DIAGNOSIS — R220 Localized swelling, mass and lump, head: Secondary | ICD-10-CM | POA: Insufficient documentation

## 2022-10-01 DIAGNOSIS — Z23 Encounter for immunization: Secondary | ICD-10-CM | POA: Diagnosis not present

## 2022-10-01 DIAGNOSIS — Z0000 Encounter for general adult medical examination without abnormal findings: Secondary | ICD-10-CM

## 2022-10-01 DIAGNOSIS — G43009 Migraine without aura, not intractable, without status migrainosus: Secondary | ICD-10-CM | POA: Diagnosis not present

## 2022-10-01 DIAGNOSIS — R21 Rash and other nonspecific skin eruption: Secondary | ICD-10-CM | POA: Diagnosis not present

## 2022-10-01 DIAGNOSIS — K625 Hemorrhage of anus and rectum: Secondary | ICD-10-CM | POA: Diagnosis not present

## 2022-10-01 DIAGNOSIS — Z0001 Encounter for general adult medical examination with abnormal findings: Secondary | ICD-10-CM

## 2022-10-01 DIAGNOSIS — G43909 Migraine, unspecified, not intractable, without status migrainosus: Secondary | ICD-10-CM | POA: Insufficient documentation

## 2022-10-01 DIAGNOSIS — R002 Palpitations: Secondary | ICD-10-CM

## 2022-10-01 DIAGNOSIS — F410 Panic disorder [episodic paroxysmal anxiety] without agoraphobia: Secondary | ICD-10-CM

## 2022-10-01 DIAGNOSIS — E785 Hyperlipidemia, unspecified: Secondary | ICD-10-CM | POA: Diagnosis not present

## 2022-10-01 HISTORY — DX: Localized swelling, mass and lump, head: R22.0

## 2022-10-01 LAB — CBC
HCT: 41.5 % (ref 36.0–46.0)
Hemoglobin: 14.1 g/dL (ref 12.0–15.0)
MCHC: 33.9 g/dL (ref 30.0–36.0)
MCV: 93 fl (ref 78.0–100.0)
Platelets: 240 10*3/uL (ref 150.0–400.0)
RBC: 4.46 Mil/uL (ref 3.87–5.11)
RDW: 12.2 % (ref 11.5–15.5)
WBC: 4.7 10*3/uL (ref 4.0–10.5)

## 2022-10-01 LAB — COMPREHENSIVE METABOLIC PANEL
ALT: 14 U/L (ref 0–35)
AST: 13 U/L (ref 0–37)
Albumin: 4.4 g/dL (ref 3.5–5.2)
Alkaline Phosphatase: 76 U/L (ref 39–117)
BUN: 10 mg/dL (ref 6–23)
CO2: 27 mEq/L (ref 19–32)
Calcium: 9.4 mg/dL (ref 8.4–10.5)
Chloride: 104 mEq/L (ref 96–112)
Creatinine, Ser: 0.7 mg/dL (ref 0.40–1.20)
GFR: 112.1 mL/min (ref >60.00)
Glucose, Bld: 84 mg/dL (ref 70–99)
Potassium: 3.9 mEq/L (ref 3.5–5.1)
Sodium: 138 mEq/L (ref 135–145)
Total Bilirubin: 0.6 mg/dL (ref 0.2–1.2)
Total Protein: 7.1 g/dL (ref 6.0–8.3)

## 2022-10-01 LAB — LIPID PANEL
Cholesterol: 213 mg/dL — ABNORMAL HIGH (ref 0–200)
HDL: 59.4 mg/dL (ref >39.00)
LDL Cholesterol: 141 mg/dL — ABNORMAL HIGH (ref 0–99)
NonHDL: 153.77
Total CHOL/HDL Ratio: 4
Triglycerides: 63 mg/dL (ref 0.0–149.0)
VLDL: 12.6 mg/dL (ref 0.0–40.0)

## 2022-10-01 LAB — TSH: TSH: 1.9 u[IU]/mL (ref 0.35–5.50)

## 2022-10-01 NOTE — Assessment & Plan Note (Signed)
She is taking levothyroxine correctly. ? ?Continue levothyroxine 25 mcg. ?Repeat TSH pending.  ?

## 2022-10-01 NOTE — Progress Notes (Signed)
Subjective:    Patient ID: Amy Schneider, female    DOB: 05/25/1985, 37 y.o.   MRN: [id number]  HPI  Stesha Arthella Headings is a very pleasant 37 y.o. female who presents today for complete physical and follow up of chronic conditions.  She would also like to discuss facial swelling with rashes. Chronic and intermittent for years. Several times monthly she will wake up noticing her cheeks/jaw/neck to be swollen. The swelling will sometimes linger for a few days. She does have known food allergies. Also with intermittent facial, chest rashes. She saw an allergist, underwent skin allergy testing. She underwent food allergy testing in 2022 which was grossly negative. She will sometimes use a topical steroid cream PRN which helps with rashes.   She does have chronic migraines. Chronic for years, typically occurs once monthly around her menstrual cycles. She will get an aura of brain fog and seeing floaters. Migraines are typically located to the bilateral parietal lobes without radiation. She also gets nauseated. Typically takes Excedrin Migraine which resolves migraines.   Immunizations: -Tetanus: Completed >10 years ago  Diet: Fair diet.  Exercise: No regular exercise.  Eye exam: Completed several years ago.  Dental exam: Completes semi-annually    Pap Smear: July 2023  BP Readings from Last 3 Encounters:  10/01/22 116/80  09/26/21 104/68  08/15/20 108/72       Review of Systems  Constitutional:  Negative for unexpected weight change.  HENT:  Negative for rhinorrhea.   Respiratory:  Negative for cough and shortness of breath.   Cardiovascular:  Negative for chest pain.  Gastrointestinal:  Negative for constipation and diarrhea.  Genitourinary:  Negative for difficulty urinating.  Musculoskeletal:  Negative for arthralgias and myalgias.  Skin:  Positive for rash.  Allergic/Immunologic: Positive for food allergies. Negative for environmental allergies.  Neurological:   Positive for headaches. Negative for dizziness.  Psychiatric/Behavioral:  The patient is not nervous/anxious.          Past Medical History:  Diagnosis Date   Bilateral ovarian cysts    Bulky and enlarged uterus 08/14/2018   08/12/2018 CT ABDOMEN AND PELVIS WITH CONTRAST The uterus is enlarged with multiple relatively homogeneously enhancing masses, largest mass measures 7.6 x 6.3 x 8.2 cm. These are probably uterine fibroids. The uterus measures 11.3 x 12.7 x 11.5 cm. The enlarged uterus causes mass effect on the bladder anteriorly and mass effect on the rectal sigmoid colon posteriorly.   Result Notes recorded by Any   GAD (generalized anxiety disorder)    Hypothyroidism    Panic attacks    Uterine masses 08/14/2018    Social History   Socioeconomic History   Marital status: Married    Spouse name: Not on file   Number of children: Not on file   Years of education: Not on file   Highest education level: Not on file  Occupational History   Not on file  Tobacco Use   Smoking status: Never   Smokeless tobacco: Never  Substance and Sexual Activity   Alcohol use: Not Currently   Drug use: Not on file   Sexual activity: Not on file  Other Topics Concern   Not on file  Social History Narrative   Married.   Works as a Associate Professor.   Chemical engineer Strain: Not on [employer] Insecurity: Not on file  Transportation Needs: Not on file  Physical Activity: Not on file  Stress: Not on file  Social Connections: Not on file  Intimate Partner Violence: Not on file    Past Surgical History:  Procedure Laterality Date   IR RADIOLOGIST EVAL & MGMT  08/20/2018   Kybella  01/2020   PARTIAL HYSTERECTOMY  11/02/2018    Family History  Problem Relation Age of Onset   Alcohol abuse Mother    Depression Mother    Early death Mother    Depression Sister    Diabetes Maternal Grandmother    Heart attack Maternal Grandmother    Heart disease  Maternal Grandmother    Stroke Maternal Grandmother    Parkinson's disease Maternal Grandfather    Breast cancer Paternal Grandmother     Allergies  Allergen Reactions   Hydrocodone-Acetaminophen Nausea And Vomiting    Current Outpatient Medications on File Prior to Visit  Medication Sig Dispense Refill   propranolol (INDERAL) 80 MG tablet TAKE 1 TABLET BY MOUTH EVERY DAY AS NEEDED FOR ANXIETY 30 tablet 0   SYNTHROID 25 MCG tablet TAKE 1 TABLET EVERY MORNING ON EMPTY STOMACH WITH WATER ONLY. NO FOOD OR OTHER MEDS FOR 30 MINUTES. 90 tablet 3   No current facility-administered medications on file prior to visit.    BP 116/80   Pulse 97   Temp 97.7 F (36.5 C) (Temporal)   Ht 5\' 4"  (1.626 m)   Wt 145 lb (65.8 kg)   LMP 08/12/2018   SpO2 100%   BMI 24.89 kg/m  Objective:   Physical Exam HENT:     Right Ear: Tympanic membrane and ear canal normal.     Left Ear: Tympanic membrane and ear canal normal.     Nose: Nose normal.  Eyes:     Conjunctiva/sclera: Conjunctivae normal.     Pupils: Pupils are equal, round, and reactive to light.  Neck:     Thyroid: No thyromegaly.  Cardiovascular:     Rate and Rhythm: Normal rate and regular rhythm.     Heart sounds: No murmur heard. Pulmonary:     Effort: Pulmonary effort is normal.     Breath sounds: Normal breath sounds. No rales.  Abdominal:     General: Bowel sounds are normal.     Palpations: Abdomen is soft.     Tenderness: There is no abdominal tenderness.  Musculoskeletal:        General: Normal range of motion.     Cervical back: Neck supple.  Lymphadenopathy:     Cervical: No cervical adenopathy.  Skin:    General: Skin is warm and dry.     Findings: No rash.  Neurological:     Mental Status: She is alert and oriented to person, place, and time.     Cranial Nerves: No cranial nerve deficit.     Deep Tendon Reflexes: Reflexes are normal and symmetric.  Psychiatric:        Mood and Affect: Mood normal.            Assessment & Plan:  Encounter for annual general medical examination with abnormal findings in adult Assessment & Plan: Tetanus due, provided today. Discussed the importance of a healthy diet and regular exercise in order for weight loss, and to reduce the risk of further co-morbidity.  Exam stable. Labs pending.  Follow up in 1 year for repeat physical.    Hypothyroidism, unspecified type Assessment & Plan: She is taking levothyroxine correctly.  Continue levothyroxine 25 mcg.   Repeat TSH pending.  Orders: -     TSH  Rectal bleeding  Assessment & Plan: Never followed up with GI last year as scheduled.   New referral placed to GI.   Orders: -     Ambulatory referral to Gastroenterology  Panic attacks Assessment & Plan: Controlled.  Continue propranolol 80 mg PRN.    Palpitations Assessment & Plan: Controlled.  Continue propranolol 80 mg PRN   Rash and nonspecific skin eruption Assessment & Plan: Evaluated by allergist.  Checking labs today including ANA, alpha gal.  Continue topical steroid cream PRN   Facial swelling Assessment & Plan: Unclear etiology. No obvious facial swelling noted on exam today.  Checking labs today including ANA, alpha gal, ACTH, CBC. Typical thyroid studies ordered and pending.  Orders: -     Comprehensive metabolic panel -     CBC -     ANA -     Alpha-Gal Panel -     ACTH  Hyperlipidemia, unspecified hyperlipidemia type -     Comprehensive metabolic panel -     Lipid panel  Migraine without aura and without status migrainosus, not intractable Assessment & Plan: Overall stable per patient.  Offered other treatment for migraine abortion including sumatriptan, she kindly declines for now but she will update if symptoms worsen. Continue Excedrin Migraine as needed.         Doreene Nest, NP

## 2022-10-01 NOTE — Patient Instructions (Signed)
Stop by the lab prior to leaving today. I will notify you of your results once received.   You will either be contacted via phone regarding your referral to GI, or you may receive a letter on your MyChart portal from our referral team with instructions for scheduling an appointment. Please let us know if you have not been contacted by anyone within two weeks.  It was a pleasure to see you today!  

## 2022-10-01 NOTE — Assessment & Plan Note (Signed)
Controlled.  Continue propranolol 80 mg PRN. 

## 2022-10-01 NOTE — Assessment & Plan Note (Signed)
Unclear etiology. No obvious facial swelling noted on exam today.  Checking labs today including ANA, alpha gal, ACTH, CBC. Typical thyroid studies ordered and pending.

## 2022-10-01 NOTE — Assessment & Plan Note (Signed)
Overall stable per patient.  Offered other treatment for migraine abortion including sumatriptan, she kindly declines for now but she will update if symptoms worsen. Continue Excedrin Migraine as needed.

## 2022-10-01 NOTE — Assessment & Plan Note (Signed)
Evaluated by allergist.  Checking labs today including ANA, alpha gal.  Continue topical steroid cream PRN

## 2022-10-01 NOTE — Assessment & Plan Note (Signed)
Tetanus due, provided today.   Discussed the importance of a healthy diet and regular exercise in order for weight loss, and to reduce the risk of further co-morbidity.  Exam stable. Labs pending.  Follow up in 1 year for repeat physical.  

## 2022-10-01 NOTE — Assessment & Plan Note (Signed)
Never followed up with GI last year as scheduled.   New referral placed to GI.

## 2022-10-03 ENCOUNTER — Encounter: Payer: Self-pay | Admitting: *Deleted

## 2022-10-03 DIAGNOSIS — R768 Other specified abnormal immunological findings in serum: Secondary | ICD-10-CM

## 2022-10-03 DIAGNOSIS — K625 Hemorrhage of anus and rectum: Secondary | ICD-10-CM

## 2022-10-04 LAB — ALPHA-GAL PANEL
Allergen, Mutton, f88: <0.1 kU/L
Allergen, Pork, f26: <0.1 kU/L
CLASS: 0
CLASS: 0

## 2022-10-04 LAB — INTERPRETATION:

## 2022-10-04 LAB — ANA: Anti Nuclear Antibody (ANA): POSITIVE — AB

## 2022-10-06 LAB — ALPHA-GAL PANEL
Beef: <0.1 kU/L
Class: 0
GALACTOSE-ALPHA-1,3-GALACTOSE IGE*: <0.1 kU/L (ref <0.10)

## 2022-10-06 LAB — ACTH: C206 ACTH: 19 pg/mL (ref 6–50)

## 2022-10-06 LAB — ANTI-NUCLEAR AB-TITER (ANA TITER): ANA Titer 1: 1:320 {titer} — ABNORMAL HIGH

## 2022-10-08 ENCOUNTER — Encounter: Payer: Self-pay | Admitting: *Deleted

## 2022-10-15 ENCOUNTER — Other Ambulatory Visit (INDEPENDENT_AMBULATORY_CARE_PROVIDER_SITE_OTHER): Payer: BC Managed Care – PPO

## 2022-10-15 DIAGNOSIS — R768 Other specified abnormal immunological findings in serum: Secondary | ICD-10-CM | POA: Diagnosis not present

## 2022-10-15 DIAGNOSIS — K625 Hemorrhage of anus and rectum: Secondary | ICD-10-CM | POA: Diagnosis not present

## 2022-10-15 LAB — SEDIMENTATION RATE: Sed Rate: 6 mm/hr (ref 0–20)

## 2022-10-16 LAB — ANTIPHOSPHOLIPID SYNDROME EVAL, BLD

## 2022-10-16 LAB — COAG STUDIES INTERP REPORT

## 2022-10-16 LAB — C3 AND C4
C3 Complement: 121 mg/dL (ref 83–193)
C4 Complement: 22 mg/dL (ref 15–57)

## 2022-10-16 LAB — ANTI-DNA ANTIBODY, DOUBLE-STRANDED: ds DNA Ab: <1 IU/mL

## 2022-10-17 ENCOUNTER — Other Ambulatory Visit: Payer: Self-pay

## 2022-10-17 DIAGNOSIS — K625 Hemorrhage of anus and rectum: Secondary | ICD-10-CM

## 2022-10-17 LAB — FECAL OCCULT BLOOD, IMMUNOCHEMICAL: Fecal Occult Bld: NEGATIVE

## 2022-10-17 NOTE — Addendum Note (Signed)
Addended by: Alvina Chou on: 10/17/2022 10:50 AM   Modules accepted: Orders

## 2022-10-20 NOTE — Telephone Encounter (Signed)
How do we get her blood results to her rheumatology appointment? Can they see our labs? Please send to referrals team.

## 2022-10-21 NOTE — Telephone Encounter (Signed)
They should have sent labs with referral but I will fax recent ones to them so they have for upcomming appointment.

## 2022-10-27 ENCOUNTER — Other Ambulatory Visit: Payer: Self-pay | Admitting: Primary Care

## 2022-10-27 DIAGNOSIS — E039 Hypothyroidism, unspecified: Secondary | ICD-10-CM

## 2022-10-29 DIAGNOSIS — R768 Other specified abnormal immunological findings in serum: Secondary | ICD-10-CM | POA: Diagnosis not present

## 2022-10-29 DIAGNOSIS — R220 Localized swelling, mass and lump, head: Secondary | ICD-10-CM | POA: Diagnosis not present

## 2022-10-29 DIAGNOSIS — H04129 Dry eye syndrome of unspecified lacrimal gland: Secondary | ICD-10-CM | POA: Diagnosis not present

## 2022-12-03 DIAGNOSIS — L820 Inflamed seborrheic keratosis: Secondary | ICD-10-CM | POA: Diagnosis not present

## 2022-12-03 DIAGNOSIS — L298 Other pruritus: Secondary | ICD-10-CM | POA: Diagnosis not present

## 2022-12-03 DIAGNOSIS — D2272 Melanocytic nevi of left lower limb, including hip: Secondary | ICD-10-CM | POA: Diagnosis not present

## 2022-12-03 DIAGNOSIS — L538 Other specified erythematous conditions: Secondary | ICD-10-CM | POA: Diagnosis not present

## 2022-12-03 DIAGNOSIS — D2261 Melanocytic nevi of right upper limb, including shoulder: Secondary | ICD-10-CM | POA: Diagnosis not present

## 2022-12-03 DIAGNOSIS — D225 Melanocytic nevi of trunk: Secondary | ICD-10-CM | POA: Diagnosis not present

## 2022-12-03 DIAGNOSIS — D2262 Melanocytic nevi of left upper limb, including shoulder: Secondary | ICD-10-CM | POA: Diagnosis not present

## 2022-12-17 DIAGNOSIS — K625 Hemorrhage of anus and rectum: Secondary | ICD-10-CM | POA: Diagnosis not present

## 2022-12-17 DIAGNOSIS — Z800 Family history of malignant neoplasm of digestive organs: Secondary | ICD-10-CM | POA: Diagnosis not present

## 2022-12-31 DIAGNOSIS — K648 Other hemorrhoids: Secondary | ICD-10-CM | POA: Diagnosis not present

## 2022-12-31 DIAGNOSIS — Z800 Family history of malignant neoplasm of digestive organs: Secondary | ICD-10-CM | POA: Diagnosis not present

## 2022-12-31 DIAGNOSIS — K5140 Inflammatory polyps of colon without complications: Secondary | ICD-10-CM | POA: Diagnosis not present

## 2022-12-31 DIAGNOSIS — K625 Hemorrhage of anus and rectum: Secondary | ICD-10-CM | POA: Diagnosis not present

## 2022-12-31 LAB — HM COLONOSCOPY

## 2023-01-13 DIAGNOSIS — F410 Panic disorder [episodic paroxysmal anxiety] without agoraphobia: Secondary | ICD-10-CM

## 2023-01-13 MED ORDER — PROPRANOLOL HCL 80 MG PO TABS
ORAL_TABLET | ORAL | 0 refills | Status: DC
Start: 2023-01-13 — End: 2023-10-07

## 2023-02-05 ENCOUNTER — Other Ambulatory Visit: Payer: Self-pay | Admitting: Primary Care

## 2023-02-05 DIAGNOSIS — F410 Panic disorder [episodic paroxysmal anxiety] without agoraphobia: Secondary | ICD-10-CM

## 2023-07-27 ENCOUNTER — Other Ambulatory Visit: Payer: Self-pay | Admitting: Primary Care

## 2023-07-27 DIAGNOSIS — E039 Hypothyroidism, unspecified: Secondary | ICD-10-CM

## 2023-07-27 NOTE — Telephone Encounter (Signed)
Patient is due for CPE/follow up in mid July, this will be required prior to any further refills.  Please schedule, thank you!   

## 2023-07-28 NOTE — Telephone Encounter (Signed)
 Lvmtcb. Sent mychart message

## 2023-07-29 NOTE — Telephone Encounter (Signed)
 Lvmtcb

## 2023-07-30 NOTE — Telephone Encounter (Signed)
 Lvmtcb

## 2023-10-07 ENCOUNTER — Ambulatory Visit: Payer: Self-pay | Admitting: Primary Care

## 2023-10-07 ENCOUNTER — Encounter: Payer: Self-pay | Admitting: Primary Care

## 2023-10-07 ENCOUNTER — Ambulatory Visit (INDEPENDENT_AMBULATORY_CARE_PROVIDER_SITE_OTHER): Admitting: Primary Care

## 2023-10-07 VITALS — BP 128/82 | HR 66 | Temp 97.7°F | Ht 65.0 in | Wt 141.0 lb

## 2023-10-07 DIAGNOSIS — E039 Hypothyroidism, unspecified: Secondary | ICD-10-CM

## 2023-10-07 DIAGNOSIS — G43009 Migraine without aura, not intractable, without status migrainosus: Secondary | ICD-10-CM | POA: Diagnosis not present

## 2023-10-07 DIAGNOSIS — F410 Panic disorder [episodic paroxysmal anxiety] without agoraphobia: Secondary | ICD-10-CM

## 2023-10-07 DIAGNOSIS — E785 Hyperlipidemia, unspecified: Secondary | ICD-10-CM | POA: Diagnosis not present

## 2023-10-07 DIAGNOSIS — Z0000 Encounter for general adult medical examination without abnormal findings: Secondary | ICD-10-CM | POA: Diagnosis not present

## 2023-10-07 DIAGNOSIS — E559 Vitamin D deficiency, unspecified: Secondary | ICD-10-CM

## 2023-10-07 LAB — CBC
HCT: 39.4 % (ref 36.0–46.0)
Hemoglobin: 13.6 g/dL (ref 12.0–15.0)
MCHC: 34.4 g/dL (ref 30.0–36.0)
MCV: 91.8 fl (ref 78.0–100.0)
Platelets: 212 K/uL (ref 150.0–400.0)
RBC: 4.29 Mil/uL (ref 3.87–5.11)
RDW: 12.2 % (ref 11.5–15.5)
WBC: 4.7 K/uL (ref 4.0–10.5)

## 2023-10-07 LAB — COMPREHENSIVE METABOLIC PANEL WITH GFR
ALT: 11 U/L (ref 0–35)
AST: 11 U/L (ref 0–37)
Albumin: 4.3 g/dL (ref 3.5–5.2)
Alkaline Phosphatase: 61 U/L (ref 39–117)
BUN: 12 mg/dL (ref 6–23)
CO2: 28 meq/L (ref 19–32)
Calcium: 9.3 mg/dL (ref 8.4–10.5)
Chloride: 105 meq/L (ref 96–112)
Creatinine, Ser: 0.76 mg/dL (ref 0.40–1.20)
GFR: 100.84 mL/min (ref >60.00)
Glucose, Bld: 80 mg/dL (ref 70–99)
Potassium: 4.1 meq/L (ref 3.5–5.1)
Sodium: 137 meq/L (ref 135–145)
Total Bilirubin: 0.4 mg/dL (ref 0.2–1.2)
Total Protein: 6.7 g/dL (ref 6.0–8.3)

## 2023-10-07 LAB — HEMOGLOBIN A1C: Hgb A1c MFr Bld: 5.1 % (ref 4.6–6.5)

## 2023-10-07 LAB — T3, FREE: T3, Free: 2.7 pg/mL (ref 2.3–4.2)

## 2023-10-07 LAB — VITAMIN B12: Vitamin B-12: 281 pg/mL (ref 211–911)

## 2023-10-07 LAB — VITAMIN D 25 HYDROXY (VIT D DEFICIENCY, FRACTURES): VITD: 13.48 ng/mL — ABNORMAL LOW (ref 30.00–100.00)

## 2023-10-07 LAB — LIPID PANEL
Cholesterol: 200 mg/dL (ref 0–200)
HDL: 59.6 mg/dL (ref >39.00)
LDL Cholesterol: 131 mg/dL — ABNORMAL HIGH (ref 0–99)
NonHDL: 140.61
Total CHOL/HDL Ratio: 3
Triglycerides: 46 mg/dL (ref 0.0–149.0)
VLDL: 9.2 mg/dL (ref 0.0–40.0)

## 2023-10-07 LAB — TSH: TSH: 1.93 u[IU]/mL (ref 0.35–5.50)

## 2023-10-07 LAB — T4, FREE: Free T4: 0.82 ng/dL (ref 0.60–1.60)

## 2023-10-07 MED ORDER — PROPRANOLOL HCL 80 MG PO TABS: ORAL_TABLET | ORAL | 0 refills | Status: AC

## 2023-10-07 NOTE — Assessment & Plan Note (Signed)
She is taking levothyroxine correctly.   Continue levothyroxine 25 mcg daily. Repeat TSH pending. 

## 2023-10-07 NOTE — Addendum Note (Signed)
 Addended by: Darnelle Derrick K on: 10/07/2023 11:43 AM   Modules accepted: Orders

## 2023-10-07 NOTE — Assessment & Plan Note (Signed)
Commended her on regular exercise and a healthy diet.  Repeat lipid panel pending.

## 2023-10-07 NOTE — Assessment & Plan Note (Signed)
 Stable and controlled.  Continue propranolol  80 mg PRN. Refills provided.

## 2023-10-07 NOTE — Patient Instructions (Signed)
 Stop by the lab prior to leaving today. I will notify you of your results once received.   It was a pleasure to see you today!

## 2023-10-07 NOTE — Assessment & Plan Note (Signed)
 Ongoing.   Declines treatment today. Continue Excedrin Migraine PRN.

## 2023-10-07 NOTE — Progress Notes (Signed)
 Subjective:    Patient ID: Amy Schneider, female    DOB: 05-15-1985, 38 y.o.   MRN: [id number]  HPI  Amy Schneider is a very pleasant 38 y.o. female who presents today for complete physical and follow up of chronic conditions.  Immunizations: -Tetanus: Completed in 2024  Diet: Fair diet.  Exercise: Regular exercise.  Eye exam: Completed years ago Dental exam: Completes semi-annually    Pap Smear: Completed in July 2023  BP Readings from Last 3 Encounters:  10/07/23 128/82  10/01/22 116/80  09/26/21 104/68       Review of Systems  Constitutional:  Negative for unexpected weight change.  HENT:  Negative for rhinorrhea.   Respiratory:  Negative for cough and shortness of breath.   Cardiovascular:  Negative for chest pain.  Gastrointestinal:  Negative for constipation and diarrhea.  Genitourinary:  Negative for difficulty urinating and menstrual problem.  Musculoskeletal:  Negative for arthralgias and myalgias.  Skin:  Negative for rash.  Allergic/Immunologic: Negative for environmental allergies.  Neurological:  Positive for headaches. Negative for dizziness and numbness.  Psychiatric/Behavioral:  The patient is not nervous/anxious.          Past Medical History:  Diagnosis Date   Bilateral ovarian cysts    Bulky and enlarged uterus 08/14/2018   08/12/2018 CT ABDOMEN AND PELVIS WITH CONTRAST The uterus is enlarged with multiple relatively homogeneously enhancing masses, largest mass measures 7.6 x 6.3 x 8.2 cm. These are probably uterine fibroids. The uterus measures 11.3 x 12.7 x 11.5 cm. The enlarged uterus causes mass effect on the bladder anteriorly and mass effect on the rectal sigmoid colon posteriorly.   Result Notes recorded by Any   Difficulty urinating 01/28/2018   Facial swelling 10/01/2022   GAD (generalized anxiety disorder)    Hypothyroidism    Panic attacks    Uterine masses 08/14/2018    Social History   Socioeconomic  History   Marital status: Married    Spouse name: Not on file   Number of children: Not on file   Years of education: Not on file   Highest education level: Not on file  Occupational History   Not on file  Tobacco Use   Smoking status: Never   Smokeless tobacco: Never  Substance and Sexual Activity   Alcohol use: Not Currently   Drug use: Not on file   Sexual activity: Not on file  Other Topics Concern   Not on file  Social History Narrative   Married.   Works as a Associate Professor.   Social Drivers of Corporate investment banker Strain: Not on [employer] Insecurity: Not on file  Transportation Needs: Not on file  Physical Activity: Not on file  Stress: Not on file  Social Connections: Not on file  Intimate Partner Violence: Not on file    Past Surgical History:  Procedure Laterality Date   IR RADIOLOGIST EVAL & MGMT  08/20/2018   Kybella  01/2020   PARTIAL HYSTERECTOMY  11/02/2018    Family History  Problem Relation Age of Onset   Alcohol abuse Mother    Depression Mother    Early death Mother    Depression Sister    Diabetes Maternal Grandmother    Heart attack Maternal Grandmother    Heart disease Maternal Grandmother    Stroke Maternal Grandmother    Parkinson's disease Maternal Grandfather    Breast cancer Paternal Grandmother     Allergies  Allergen Reactions   Hydrocodone-Acetaminophen   Nausea And Vomiting    Current Outpatient Medications on File Prior to Visit  Medication Sig Dispense Refill   levothyroxine (SYNTHROID ) 25 MCG tablet TAKE 1 TABLET EVERY MORNING ON EMPTY STOMACH WITH WATER ONLY. NO FOOD OR OTHER MEDS FOR 30 MINUTES. 90 tablet 0   No current facility-administered medications on file prior to visit.    BP 128/82   Pulse 66   Temp 97.7 F (36.5 C) (Temporal)   Ht 5' 5 (1.651 m)   Wt 141 lb (64 kg)   LMP 08/12/2018   SpO2 98%   BMI 23.46 kg/m  Objective:   Physical Exam HENT:     Right Ear: Tympanic membrane and ear canal  normal.     Left Ear: Tympanic membrane and ear canal normal.  Eyes:     Pupils: Pupils are equal, round, and reactive to light.  Cardiovascular:     Rate and Rhythm: Normal rate and regular rhythm.  Pulmonary:     Effort: Pulmonary effort is normal.     Breath sounds: Normal breath sounds.  Abdominal:     General: Bowel sounds are normal.     Palpations: Abdomen is soft.     Tenderness: There is no abdominal tenderness.  Musculoskeletal:        General: Normal range of motion.     Cervical back: Neck supple.  Skin:    General: Skin is warm and dry.  Neurological:     Mental Status: She is alert and oriented to person, place, and time.     Cranial Nerves: No cranial nerve deficit.     Deep Tendon Reflexes:     Reflex Scores:      Patellar reflexes are 2+ on the right side and 2+ on the left side. Psychiatric:        Mood and Affect: Mood normal.           Assessment & Plan:  Preventative health care Assessment & Plan: Immunizations UTD. Pap smear UTD.  Discussed the importance of a healthy diet and regular exercise in order for weight loss, and to reduce the risk of further co-morbidity.  Exam stable. Labs pending.  Follow up in 1 year for repeat physical.    Panic attacks Assessment & Plan: Stable and controlled.  Continue propranolol  80 mg PRN. Refills provided.  Orders: -     Propranolol  HCl; TAKE 1 TABLET BY MOUTH EVERY DAY AS NEEDED FOR ANXIETY  Dispense: 30 tablet; Refill: 0  Migraine without aura and without status migrainosus, not intractable Assessment & Plan: Ongoing.   Declines treatment today. Continue Excedrin Migraine PRN.   Hyperlipidemia, unspecified hyperlipidemia type Assessment & Plan: Commended her on regular exercise and a healthy diet!  Repeat lipid panel pending.  Orders: -     Lipid panel -     Comprehensive metabolic panel with GFR -     Hemoglobin A1c -     Vitamin B12 -     VITAMIN D  25 Hydroxy (Vit-D Deficiency,  Fractures) -     CBC  Hypothyroidism, unspecified type Assessment & Plan: She is taking levothyroxine correctly.  Continue levothyroxine 25 mcg daily. Repeat TSH pending.  Orders: -     Vitamin B12 -     VITAMIN D  25 Hydroxy (Vit-D Deficiency, Fractures) -     TSH        Comer MARLA Gaskins, NP

## 2023-10-07 NOTE — Assessment & Plan Note (Signed)
Immunizations UTD. Pap smear UTD  Discussed the importance of a healthy diet and regular exercise in order for weight loss, and to reduce the risk of further co-morbidity.  Exam stable. Labs pending.  Follow up in 1 year for repeat physical.  

## 2023-10-09 MED ORDER — VITAMIN D (ERGOCALCIFEROL) 1.25 MG (50000 UNIT) PO CAPS
ORAL_CAPSULE | ORAL | 0 refills | Status: AC
Start: 2023-10-09 — End: ?

## 2023-10-29 ENCOUNTER — Other Ambulatory Visit: Payer: Self-pay | Admitting: Primary Care

## 2023-10-29 DIAGNOSIS — E039 Hypothyroidism, unspecified: Secondary | ICD-10-CM

## 2023-10-29 DIAGNOSIS — F410 Panic disorder [episodic paroxysmal anxiety] without agoraphobia: Secondary | ICD-10-CM

## 2023-12-09 DIAGNOSIS — L821 Other seborrheic keratosis: Secondary | ICD-10-CM | POA: Diagnosis not present

## 2023-12-09 DIAGNOSIS — L400 Psoriasis vulgaris: Secondary | ICD-10-CM | POA: Diagnosis not present

## 2023-12-28 ENCOUNTER — Other Ambulatory Visit: Payer: Self-pay | Admitting: Primary Care

## 2023-12-28 DIAGNOSIS — E559 Vitamin D deficiency, unspecified: Secondary | ICD-10-CM

## 2023-12-28 NOTE — Telephone Encounter (Signed)
 Patient needs lab only appointment for repeat vitamin D  level to check since she had the 3 months of once weekly dose.

## 2023-12-31 ENCOUNTER — Telehealth: Payer: Self-pay

## 2023-12-31 NOTE — Telephone Encounter (Signed)
 Copied from CRM [id number]. Topic: Appointments - Scheduling Inquiry for Clinic >> Dec 31, 2023  9:38 AM Anairis L wrote: Reason for CRM: Patient would like to schedule Vitamin D  labs. Please reach out to patient.   Lab order in chart please call patient to schedule set up lab appointment

## 2024-01-13 ENCOUNTER — Other Ambulatory Visit

## 2024-01-13 DIAGNOSIS — E559 Vitamin D deficiency, unspecified: Secondary | ICD-10-CM

## 2024-01-13 LAB — VITAMIN D 25 HYDROXY (VIT D DEFICIENCY, FRACTURES): VITD: 25.66 ng/mL — ABNORMAL LOW (ref 30.00–100.00)

## 2024-01-14 ENCOUNTER — Ambulatory Visit: Payer: Self-pay | Admitting: Primary Care

## 2024-01-14 DIAGNOSIS — E559 Vitamin D deficiency, unspecified: Secondary | ICD-10-CM
